# Patient Record
Sex: Male | Born: 1950
Health system: Southern US, Community
[De-identification: ages and names within clinical notes are randomized; demographics above are authoritative.]

## PROBLEM LIST (undated history)

## (undated) DIAGNOSIS — R911 Solitary pulmonary nodule: Secondary | ICD-10-CM

## (undated) DIAGNOSIS — F419 Anxiety disorder, unspecified: Secondary | ICD-10-CM

## (undated) DIAGNOSIS — J439 Emphysema, unspecified: Secondary | ICD-10-CM

## (undated) DIAGNOSIS — H04123 Dry eye syndrome of bilateral lacrimal glands: Secondary | ICD-10-CM

## (undated) DIAGNOSIS — T7840XA Allergy, unspecified, initial encounter: Secondary | ICD-10-CM

## (undated) DIAGNOSIS — K56609 Unspecified intestinal obstruction, unspecified as to partial versus complete obstruction: Secondary | ICD-10-CM

## (undated) DIAGNOSIS — Z87891 Personal history of nicotine dependence: Secondary | ICD-10-CM

## (undated) DIAGNOSIS — J449 Chronic obstructive pulmonary disease, unspecified: Secondary | ICD-10-CM

## (undated) DIAGNOSIS — Z5189 Encounter for other specified aftercare: Secondary | ICD-10-CM

## (undated) DIAGNOSIS — E739 Lactose intolerance, unspecified: Secondary | ICD-10-CM

## (undated) DIAGNOSIS — K5792 Diverticulitis of intestine, part unspecified, without perforation or abscess without bleeding: Secondary | ICD-10-CM

## (undated) DIAGNOSIS — N529 Male erectile dysfunction, unspecified: Secondary | ICD-10-CM

## (undated) HISTORY — DX: Anxiety disorder, unspecified: F41.9

## (undated) HISTORY — DX: Emphysema, unspecified: J43.9

## (undated) HISTORY — DX: Dry eye syndrome of bilateral lacrimal glands: H04.123

## (undated) HISTORY — DX: Allergy, unspecified, initial encounter: T78.40XA

## (undated) HISTORY — DX: Personal history of nicotine dependence: Z87.891

## (undated) HISTORY — DX: Solitary pulmonary nodule: R91.1

## (undated) HISTORY — PX: LUNG SURGERY: SHX703

## (undated) HISTORY — DX: Unspecified intestinal obstruction, unspecified as to partial versus complete obstruction: K56.609

## (undated) HISTORY — PX: HEMORRHOID SURGERY: SHX153

## (undated) HISTORY — PX: POLYPECTOMY: SHX149

## (undated) HISTORY — DX: Male erectile dysfunction, unspecified: N52.9

## (undated) HISTORY — PX: COLONOSCOPY: SHX174

## (undated) HISTORY — DX: Lactose intolerance, unspecified: E73.9

## (undated) HISTORY — DX: Chronic obstructive pulmonary disease, unspecified: J44.9

## (undated) HISTORY — DX: Diverticulitis of intestine, part unspecified, without perforation or abscess without bleeding: K57.92

## (undated) HISTORY — DX: Encounter for other specified aftercare: Z51.89

---

## 2005-04-16 ENCOUNTER — Encounter: Admission: RE | Admit: 2005-04-16 | Discharge: 2005-04-16 | Payer: Self-pay | Admitting: Emergency Medicine

## 2012-01-30 ENCOUNTER — Encounter: Payer: Self-pay | Admitting: Family Medicine

## 2012-01-30 ENCOUNTER — Ambulatory Visit (INDEPENDENT_AMBULATORY_CARE_PROVIDER_SITE_OTHER): Payer: BC Managed Care – PPO | Admitting: Family Medicine

## 2012-01-30 VITALS — BP 124/78 | HR 92 | Temp 97.6°F | Ht 71.0 in | Wt 221.0 lb

## 2012-01-30 DIAGNOSIS — Z8042 Family history of malignant neoplasm of prostate: Secondary | ICD-10-CM

## 2012-01-30 DIAGNOSIS — Z13220 Encounter for screening for lipoid disorders: Secondary | ICD-10-CM

## 2012-01-30 DIAGNOSIS — R634 Abnormal weight loss: Secondary | ICD-10-CM

## 2012-01-30 DIAGNOSIS — J449 Chronic obstructive pulmonary disease, unspecified: Secondary | ICD-10-CM

## 2012-01-30 DIAGNOSIS — N529 Male erectile dysfunction, unspecified: Secondary | ICD-10-CM

## 2012-01-30 MED ORDER — BECLOMETHASONE DIPROP MONOHYD 42 MCG/SPRAY NA SUSP: 2.0000 | Freq: Two times a day (BID) | NASAL | Status: DC

## 2012-01-30 NOTE — Progress Notes (Signed)
New pt.  Old records requested.    H/o emphysema.  Stopped smoking.  H/o PNA late 3/13.  Had been sick mult times in early 2013.  He 'has had a hard 2 weeks.' prev with fever, dec in appetite, was SOB.  Was coughing more now than prev.  Still using his inhalers.  Some improvement now.    FH prostate cancer.  Due for screening.  Inc in nocturia.  No burning with urination.  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    H/o ED- no help with levitra, cialis, viagra.  Had a HA with all 3.    He's losing weight, down about 18 lbs.  He does have diet change (trying to eat a healthy diet) but this is along with lack of appetite (this new).  No vomiting, but he does have more gas than usual.  No diarrhea except while on abx.  No blood in stool.  PMH and SH reviewed  ROS: See HPI, otherwise noncontributory.  Meds, vitals, and allergies reviewed.   GEN: nad, alert and oriented HEENT: mucous membranes moist, nasal epithelium mildly irritated NECK: supple w/o LA CV: rrr. PULM: ctab, no inc wob ABD: soft, +bs EXT: no edema SKIN: no acute rash Prostate gland firm and smooth, no enlargement, nodularity, tenderness, mass, asymmetry or induration.

## 2012-01-30 NOTE — Patient Instructions (Signed)
Don't change your meds for now.  Return for fasting labs.  Take care.  If the weight loss continues, let me know.

## 2012-02-02 ENCOUNTER — Encounter: Payer: Self-pay | Admitting: Family Medicine

## 2012-02-02 DIAGNOSIS — J449 Chronic obstructive pulmonary disease, unspecified: Secondary | ICD-10-CM | POA: Insufficient documentation

## 2012-02-02 DIAGNOSIS — N529 Male erectile dysfunction, unspecified: Secondary | ICD-10-CM | POA: Insufficient documentation

## 2012-02-02 DIAGNOSIS — R634 Abnormal weight loss: Secondary | ICD-10-CM | POA: Insufficient documentation

## 2012-02-02 DIAGNOSIS — Z8042 Family history of malignant neoplasm of prostate: Secondary | ICD-10-CM | POA: Insufficient documentation

## 2012-02-02 NOTE — Assessment & Plan Note (Signed)
Requesting old records.  Return for labs. He agrees.  This could be due to recent illness. Will follow clinically and he'll notify us if continued. Nontoxic.

## 2012-02-02 NOTE — Assessment & Plan Note (Signed)
With recent illness noted.  Lungs ctab today and d/w pt.  Requesting old records.  Return for labs. He agrees.

## 2012-02-02 NOTE — Assessment & Plan Note (Addendum)
DRE wnl, return for PSA.   Addendum- PSA elevated, refer to uro.

## 2012-02-06 ENCOUNTER — Other Ambulatory Visit (INDEPENDENT_AMBULATORY_CARE_PROVIDER_SITE_OTHER): Payer: BC Managed Care – PPO

## 2012-02-06 DIAGNOSIS — R634 Abnormal weight loss: Secondary | ICD-10-CM

## 2012-02-06 DIAGNOSIS — Z8042 Family history of malignant neoplasm of prostate: Secondary | ICD-10-CM

## 2012-02-06 DIAGNOSIS — Z13220 Encounter for screening for lipoid disorders: Secondary | ICD-10-CM

## 2012-02-06 LAB — LIPID PANEL
HDL: 27.3 mg/dL — ABNORMAL LOW (ref >39.00)
Total CHOL/HDL Ratio: 5
Triglycerides: 100 mg/dL (ref 0.0–149.0)
VLDL: 20 mg/dL (ref 0.0–40.0)

## 2012-02-06 LAB — COMPREHENSIVE METABOLIC PANEL
ALT: 25 U/L (ref 0–53)
Calcium: 9.2 mg/dL (ref 8.4–10.5)
Chloride: 100 mEq/L (ref 96–112)
GFR: 77.74 mL/min (ref >60.00)
Glucose, Bld: 96 mg/dL (ref 70–99)
Potassium: 5 mEq/L (ref 3.5–5.1)
Sodium: 138 mEq/L (ref 135–145)

## 2012-02-06 LAB — CBC WITH DIFFERENTIAL/PLATELET
Basophils Relative: 1.5 % (ref 0.0–3.0)
Eosinophils Absolute: 0.6 10*3/uL (ref 0.0–0.7)
HCT: 43.9 % (ref 39.0–52.0)
Lymphocytes Relative: 25.5 % (ref 12.0–46.0)
MCHC: 33.6 g/dL (ref 30.0–36.0)
Neutro Abs: 5 10*3/uL (ref 1.4–7.7)
Neutrophils Relative %: 59 % (ref 43.0–77.0)
Platelets: 566 10*3/uL — ABNORMAL HIGH (ref 150.0–400.0)
WBC: 8.5 10*3/uL (ref 4.5–10.5)

## 2012-02-06 LAB — TSH: TSH: 1.2 u[IU]/mL (ref 0.35–5.50)

## 2012-02-08 NOTE — Progress Notes (Signed)
Addended by: Lars Mage on: 02/08/2012 11:34 PM   Modules accepted: Orders

## 2012-02-19 ENCOUNTER — Telehealth: Payer: Self-pay

## 2012-02-19 DIAGNOSIS — J189 Pneumonia, unspecified organism: Secondary | ICD-10-CM

## 2012-02-19 NOTE — Telephone Encounter (Signed)
Pt is not taking Spiriva as often and prostate is not as large as when seen by Dr Para March 01/30/12. Pt will keep appt with urologist on 03/05/12. Pt called to cancel appt with Dr Clarene Duke and pt spoke with Dr Clarene Duke re: cxr pt had 1 1/2 months ago which showed spots on lung and pt treated for pneumonia.Dr Clarene Duke suggest cxr repeated and will sent cxr to Dr Para March today. Pt can be reached at [phone]. Pt uses [facility] if pharmacy needed. Pt also said if needs any appt should be on a Friday.

## 2012-02-19 NOTE — Telephone Encounter (Signed)
If it was rec'd that patient have f/u CXR, then 6 week f/u is reasonable (that would be about this time).  See if he can be added to the schedule with CXR on the way in.  Orders are in.  I'll await the cxr from the outside clinic.

## 2012-02-19 NOTE — Telephone Encounter (Signed)
Appt scheduled 11:15 on 02/20/2012 for repeat CXR and follow up with [facility].

## 2012-02-20 ENCOUNTER — Ambulatory Visit (INDEPENDENT_AMBULATORY_CARE_PROVIDER_SITE_OTHER)
Admission: RE | Admit: 2012-02-20 | Discharge: 2012-02-20 | Disposition: A | Discharge status: Home / Self Care | Payer: BC Managed Care – PPO | Source: Ambulatory Visit | Attending: Family Medicine | Admitting: Family Medicine

## 2012-02-20 ENCOUNTER — Ambulatory Visit (INDEPENDENT_AMBULATORY_CARE_PROVIDER_SITE_OTHER): Payer: BC Managed Care – PPO | Admitting: Family Medicine

## 2012-02-20 ENCOUNTER — Encounter: Payer: Self-pay | Admitting: Family Medicine

## 2012-02-20 VITALS — BP 116/76 | HR 74 | Temp 98.6°F | Wt 220.0 lb

## 2012-02-20 DIAGNOSIS — J189 Pneumonia, unspecified organism: Secondary | ICD-10-CM

## 2012-02-20 DIAGNOSIS — J449 Chronic obstructive pulmonary disease, unspecified: Secondary | ICD-10-CM

## 2012-02-20 MED ORDER — BUDESONIDE-FORMOTEROL FUMARATE 160-4.5 MCG/ACT IN AERO: 1.0000 | INHALATION_SPRAY | Freq: Two times a day (BID) | RESPIRATORY_TRACT | Status: DC

## 2012-02-20 NOTE — Patient Instructions (Signed)
Stop the spiriva. Use symbicort 1 puff twice a day.  See if that helps.  If so, continue.  If not, notify me.  Keep the appointment with urology.  We'll contact you with your xray report.

## 2012-02-20 NOTE — Progress Notes (Signed)
Prev CXR at outside facility with concern for PNA on posterior lower lobe on lateral film.  Repeat CXR done today.  Cough is improved and he's taking spiriva less.  He's much improved with the nasal steroid.  He may need another med other than spiriva due to urinary retention.  He is a former smoker.  His cough is worse in the fall and spring.    He is going to see uro about his elevated PSA.  Weight is stable.    Meds, vitals, and allergies reviewed.   ROS: See HPI.  Otherwise, noncontributory.  nad ncat Mmm rrr ctab Ext well perfused.   cxr reviewed.

## 2012-02-22 NOTE — Assessment & Plan Note (Addendum)
Stop spiriva, try sample of symbicort and see if that helps. See notes on CXR report, will notify pt.  I was awaiting overread.  >25 min spent with face to face with patient, >50% counseling and/or coordinating care. I have reviewed prev CXR report, today's CXR and will be in contact with patient re: report.

## 2012-02-23 ENCOUNTER — Other Ambulatory Visit: Payer: Self-pay | Admitting: Family Medicine

## 2012-02-23 DIAGNOSIS — R9389 Abnormal findings on diagnostic imaging of other specified body structures: Secondary | ICD-10-CM

## 2012-02-24 ENCOUNTER — Other Ambulatory Visit: Payer: Self-pay | Admitting: Family Medicine

## 2012-02-24 DIAGNOSIS — R9389 Abnormal findings on diagnostic imaging of other specified body structures: Secondary | ICD-10-CM

## 2012-03-05 ENCOUNTER — Other Ambulatory Visit: Payer: Self-pay | Admitting: Family Medicine

## 2012-03-05 ENCOUNTER — Ambulatory Visit (INDEPENDENT_AMBULATORY_CARE_PROVIDER_SITE_OTHER)
Admission: RE | Admit: 2012-03-05 | Discharge: 2012-03-05 | Disposition: A | Discharge status: Home / Self Care | Payer: BC Managed Care – PPO | Source: Ambulatory Visit | Attending: Family Medicine | Admitting: Family Medicine

## 2012-03-05 DIAGNOSIS — R918 Other nonspecific abnormal finding of lung field: Secondary | ICD-10-CM

## 2012-03-05 DIAGNOSIS — R9389 Abnormal findings on diagnostic imaging of other specified body structures: Secondary | ICD-10-CM

## 2012-03-05 MED ORDER — IOHEXOL 300 MG/ML  SOLN
80.0000 mL | Freq: Once | INTRAMUSCULAR | Status: AC | PRN
  Administered 2012-03-05: 80 mL via INTRAVENOUS

## 2012-03-08 ENCOUNTER — Telehealth: Payer: Self-pay | Admitting: Family Medicine

## 2012-03-08 NOTE — Telephone Encounter (Signed)
Patient requests results on repeated PSA tests.  Please call patient to discuss.

## 2012-03-08 NOTE — Telephone Encounter (Signed)
I called pt. The repeat PSA was at [facility].  I don't have those results yet.  He'll call uro about the results.

## 2012-03-09 ENCOUNTER — Telehealth: Payer: Self-pay

## 2012-03-09 NOTE — Telephone Encounter (Signed)
Noted.  I'll await uro notes and input from Dr. Delford Field.

## 2012-03-09 NOTE — Telephone Encounter (Signed)
Pt said was seen at [facility] and wanted Dr Para March to know pts most recent PSA was 2.69. Pt can be reached at [phone]. Pt also wanted to know if appt already scheduled with Dr Delford Field on 03/12/12 pt can have biopsy done then. Explained it was a consultation appt and Dr Delford Field will talk with pt at that time but a biopsy is not scheduled on 03/12/12.

## 2012-03-12 ENCOUNTER — Ambulatory Visit (INDEPENDENT_AMBULATORY_CARE_PROVIDER_SITE_OTHER): Payer: BC Managed Care – PPO | Admitting: Critical Care Medicine

## 2012-03-12 ENCOUNTER — Encounter: Payer: Self-pay | Admitting: Critical Care Medicine

## 2012-03-12 ENCOUNTER — Other Ambulatory Visit: Payer: Self-pay

## 2012-03-12 VITALS — BP 122/88 | HR 75 | Temp 97.7°F | Ht 71.0 in | Wt 220.4 lb

## 2012-03-12 DIAGNOSIS — J449 Chronic obstructive pulmonary disease, unspecified: Secondary | ICD-10-CM

## 2012-03-12 DIAGNOSIS — R918 Other nonspecific abnormal finding of lung field: Secondary | ICD-10-CM

## 2012-03-12 DIAGNOSIS — R222 Localized swelling, mass and lump, trunk: Secondary | ICD-10-CM

## 2012-03-12 DIAGNOSIS — R9389 Abnormal findings on diagnostic imaging of other specified body structures: Secondary | ICD-10-CM | POA: Insufficient documentation

## 2012-03-12 MED ORDER — BUDESONIDE-FORMOTEROL FUMARATE 160-4.5 MCG/ACT IN AERO: 2.0000 | INHALATION_SPRAY | Freq: Two times a day (BID) | RESPIRATORY_TRACT | Status: DC

## 2012-03-12 NOTE — Telephone Encounter (Signed)
Sent!

## 2012-03-12 NOTE — Assessment & Plan Note (Signed)
Moderate chronic obstructive lung disease with emphysematous component on CT scan Plan Patient will increase Symbicort to 2 puffs twice daily

## 2012-03-12 NOTE — Telephone Encounter (Signed)
Pt walked in; saw Dr Delford Field today; Symbicort increased to 2 puffs twice a day.Pt needs more samples or refill with instruction changes. No available samples.[facility].Please advise.

## 2012-03-12 NOTE — Progress Notes (Signed)
Subjective:    Patient ID: Randall Davis, male    DOB: [birth year], 61 y.o.   MRN: [id number]  HPI  61 y.o. WM   Referred for abn CT.  Started with chest pain RUL area 3/13. DX PNA.  Rx ABX and prednisone. Rov 10days later, felt somewhat better.  Tfr MDs to Dr Para March.  Abn CXR on repeat. CT chest done and now here for eval. Dx Copd with emphysema. 67yrs ago after quit smoking .  Prior rx spiriva. Only using prn. Allergies make dyspnea worse.  Prostate swollen and off spiriva completely. Now on symbicort and prostate.  Uses symbicort 2puffs daily .  Albuterol is aves twice daily.   Also takes 30mg  pseudofed OTC Pt has a lot of pndrip.   Now no real cough.  Dyspnea is now better.  No longer has pain in chest area.  Past Medical History  Diagnosis Date  . Former smoker   . COPD (chronic obstructive pulmonary disease)   . Allergy      Family History  Problem Relation Age of Onset  . Asthma Mother   . Prostate cancer Father   . Colon cancer Neg Hx   . Allergies Mother      History   Social History  . Marital Status: Married    Spouse Name: N/A    Number of Children: N/A  . Years of Education: N/A   Occupational History  . [employer] doctor    Social History Main Topics  . Smoking status: Former Smoker -- 1.5 packs/day for 40 years    Types: Cigarettes    Quit date: 10/20/2001  . Smokeless tobacco: Never Used  . Alcohol Use: Yes     1 drink per week  . Drug Use: No  . Sexually Active: Not on file   Other Topics Concern  . Not on file   Social History Narrative   Married [id number] kidsTrucker, former Administrator, sports workLikes to sail     Allergies  Allergen Reactions  . Novocain (Procaine Hcl)     Intolerant in distant past  . Spiriva (Tiotropium Bromide Monohydrate)     Urinary retention     Outpatient Prescriptions Prior to Visit  Medication Sig Dispense Refill  . albuterol (PROVENTIL HFA;VENTOLIN HFA) 108 (90 BASE) MCG/ACT inhaler Inhale 2 puffs into the lungs every 6  (six) hours as needed.      . B Complex-C (B-COMPLEX WITH VITAMIN C) tablet Take 1 tablet by mouth daily as needed.       . beclomethasone (BECONASE-AQ) 42 MCG/SPRAY nasal spray Place 2 sprays into the nose 2 (two) times daily. Dose is for each nostril.  25 g  12  . dextromethorphan-guaiFENesin (MUCINEX DM) 30-600 MG per 12 hr tablet Take 1 tablet by mouth every 12 (twelve) hours as needed.       . Potassium 75 MG TABS Take 1 tablet by mouth. Once weekly      . pseudoephedrine (SUDAFED) 30 MG tablet Take 30 mg by mouth every 6 (six) hours as needed.      . budesonide-formoterol (SYMBICORT) 160-4.5 MCG/ACT inhaler Inhale 1 puff into the lungs 2 (two) times daily.  1 Inhaler  0     Review of Systems  Constitutional: Positive for appetite change and unexpected weight change. Negative for fever, chills, diaphoresis, activity change and fatigue.  HENT: Positive for congestion, rhinorrhea, neck stiffness, dental problem and sinus pressure. Negative for hearing loss, ear pain, nosebleeds, sore throat, facial swelling, sneezing,  mouth sores, trouble swallowing, neck pain, voice change, postnasal drip, tinnitus and ear discharge.   Eyes: Positive for itching. Negative for photophobia, discharge and visual disturbance.  Respiratory: Positive for cough and shortness of breath. Negative for apnea, choking, chest tightness, wheezing and stridor.   Cardiovascular: Positive for chest pain. Negative for palpitations and leg swelling.  Gastrointestinal: Negative for nausea, vomiting, abdominal pain, constipation, blood in stool and abdominal distention.  Genitourinary: Positive for decreased urine volume and difficulty urinating. Negative for dysuria, urgency, frequency, hematuria and flank pain.  Musculoskeletal: Negative for myalgias, back pain, joint swelling, arthralgias and gait problem.  Skin: Negative for color change, pallor and rash.  Neurological: Positive for dizziness and headaches. Negative for  tremors, seizures, syncope, speech difficulty, weakness, light-headedness and numbness.  Hematological: Negative for adenopathy. Does not bruise/bleed easily.  Psychiatric/Behavioral: Positive for sleep disturbance. Negative for confusion and agitation. The patient is not nervous/anxious.        Objective:   Physical Exam  Filed Vitals:   03/12/12 1037  BP: 122/88  Pulse: 75  Temp: 97.7 F (36.5 C)  TempSrc: Oral  Height: 5\' 11"  (1.803 m)  Weight: 220 lb 6.4 oz (99.973 kg)  SpO2: 96%    Gen: Pleasant, well-nourished, in no distress,  normal affect  ENT: No lesions,  mouth clear,  oropharynx clear, no postnasal drip  Neck: No JVD, no TMG, no carotid bruits  Lungs: No use of accessory muscles, no dullness to percussion, distant breath sounds  Cardiovascular: RRR, heart sounds normal, no murmur or gallops, no peripheral edema  Abdomen: soft and NT, no HSM,  BS normal  Musculoskeletal: No deformities, no cyanosis or clubbing  Neuro: alert, non focal  Skin: Warm, no lesions or rashes  CT scan 02/23/2012 is reviewed and reveals fibrotic in inflammatory changes right upper lobe with associated mediastinal and subcarinal mild adenopathy along with fibrotic changes throughout the lungs with centrilobular emphysema changes, there is a large cystic area in the left lower lobe which is likely the source of the patient's prior pneumothorax at age 21      Assessment & Plan:   COPD (chronic obstructive pulmonary disease) Moderate chronic obstructive lung disease with emphysematous component on CT scan Plan Patient will increase Symbicort to 2 puffs twice daily  Abnormal CT of the chest Right upper lobe scarring post pneumonia in the setting of fibrotic disease in the lung and emphysematous changes due to chronic obstructive lung disease. Cannot rule out malignancy in this setting Plan Pursue bronchoscopy on 03/19/2012 Further recommendations will follow    Updated  Medication List Outpatient Encounter Prescriptions as of 03/12/2012  Medication Sig Dispense Refill  . albuterol (PROVENTIL HFA;VENTOLIN HFA) 108 (90 BASE) MCG/ACT inhaler Inhale 2 puffs into the lungs every 6 (six) hours as needed.      . B Complex-C (B-COMPLEX WITH VITAMIN C) tablet Take 1 tablet by mouth daily as needed.       . beclomethasone (BECONASE-AQ) 42 MCG/SPRAY nasal spray Place 2 sprays into the nose 2 (two) times daily. Dose is for each nostril.  25 g  12  . budesonide-formoterol (SYMBICORT) 160-4.5 MCG/ACT inhaler Inhale 2 puffs into the lungs 2 (two) times daily.  1 Inhaler    . dextromethorphan-guaiFENesin (MUCINEX DM) 30-600 MG per 12 hr tablet Take 1 tablet by mouth every 12 (twelve) hours as needed.       . Potassium 75 MG TABS Take 1 tablet by mouth. Once weekly      .  pseudoephedrine (SUDAFED) 30 MG tablet Take 30 mg by mouth every 6 (six) hours as needed.      Marland Kitchen DISCONTD: budesonide-formoterol (SYMBICORT) 160-4.5 MCG/ACT inhaler Inhale 1 puff into the lungs 2 (two) times daily.  1 Inhaler  0  . DISCONTD: budesonide-formoterol (SYMBICORT) 160-4.5 MCG/ACT inhaler Inhale 2 puffs into the lungs every morning.

## 2012-03-12 NOTE — Assessment & Plan Note (Signed)
Right upper lobe scarring post pneumonia in the setting of fibrotic disease in the lung and emphysematous changes due to chronic obstructive lung disease. Cannot rule out malignancy in this setting Plan Pursue bronchoscopy on 03/19/2012 Further recommendations will follow

## 2012-03-12 NOTE — Patient Instructions (Addendum)
A bronchoscopy will be obtained at 5/31/3 at 130pm arrive at 1230pm Increase symbicort to two puff twice daily Pulmonary functions will be obtained Return 3 weeks

## 2012-03-18 ENCOUNTER — Encounter (HOSPITAL_COMMUNITY): Payer: Self-pay

## 2012-03-19 ENCOUNTER — Encounter (HOSPITAL_COMMUNITY)
Admission: RE | Disposition: A | Discharge status: Home / Self Care | Payer: Self-pay | Source: Ambulatory Visit | Attending: Critical Care Medicine

## 2012-03-19 ENCOUNTER — Ambulatory Visit (HOSPITAL_COMMUNITY): Payer: BC Managed Care – PPO

## 2012-03-19 ENCOUNTER — Ambulatory Visit (HOSPITAL_COMMUNITY)
Admission: RE | Admit: 2012-03-19 | Discharge: 2012-03-19 | Disposition: A | Discharge status: Home / Self Care | Payer: BC Managed Care – PPO | Source: Ambulatory Visit | Attending: Critical Care Medicine | Admitting: Critical Care Medicine

## 2012-03-19 ENCOUNTER — Encounter (HOSPITAL_COMMUNITY): Payer: Self-pay | Admitting: Respiratory Therapy

## 2012-03-19 DIAGNOSIS — R918 Other nonspecific abnormal finding of lung field: Secondary | ICD-10-CM

## 2012-03-19 DIAGNOSIS — J449 Chronic obstructive pulmonary disease, unspecified: Secondary | ICD-10-CM | POA: Diagnosis present

## 2012-03-19 DIAGNOSIS — R222 Localized swelling, mass and lump, trunk: Secondary | ICD-10-CM

## 2012-03-19 DIAGNOSIS — Z87891 Personal history of nicotine dependence: Secondary | ICD-10-CM | POA: Insufficient documentation

## 2012-03-19 DIAGNOSIS — J438 Other emphysema: Secondary | ICD-10-CM | POA: Insufficient documentation

## 2012-03-19 DIAGNOSIS — J984 Other disorders of lung: Secondary | ICD-10-CM | POA: Insufficient documentation

## 2012-03-19 DIAGNOSIS — Z79899 Other long term (current) drug therapy: Secondary | ICD-10-CM | POA: Insufficient documentation

## 2012-03-19 DIAGNOSIS — R9389 Abnormal findings on diagnostic imaging of other specified body structures: Secondary | ICD-10-CM | POA: Diagnosis present

## 2012-03-19 HISTORY — PX: VIDEO BRONCHOSCOPY: SHX5072

## 2012-03-19 SURGERY — BRONCHOSCOPY, WITH FLUOROSCOPY
Anesthesia: Moderate Sedation

## 2012-03-19 MED ORDER — FENTANYL CITRATE 0.05 MG/ML IJ SOLN
INTRAMUSCULAR | Status: AC
  Filled 2012-03-19: qty 4

## 2012-03-19 MED ORDER — MIDAZOLAM HCL 10 MG/2ML IJ SOLN
INTRAMUSCULAR | Status: AC
  Filled 2012-03-19: qty 4

## 2012-03-19 MED ORDER — BUTAMBEN-TETRACAINE-BENZOCAINE 2-2-14 % EX AERO: 1.0000 | INHALATION_SPRAY | Freq: Once | CUTANEOUS | Status: DC

## 2012-03-19 MED ORDER — FENTANYL CITRATE 0.05 MG/ML IJ SOLN
INTRAMUSCULAR | Status: DC | PRN
  Administered 2012-03-19: 20 ug via INTRAVENOUS
  Administered 2012-03-19: 30 ug via INTRAVENOUS

## 2012-03-19 MED ORDER — LIDOCAINE HCL 2 % EX GEL: Freq: Once | CUTANEOUS | Status: DC

## 2012-03-19 MED ORDER — SODIUM CHLORIDE 0.9 % IV SOLN
INTRAVENOUS | Status: DC
  Administered 2012-03-19: 13:00:00 via INTRAVENOUS

## 2012-03-19 MED ORDER — PHENYLEPHRINE HCL 0.25 % NA SOLN
NASAL | Status: DC | PRN
  Administered 2012-03-19: 1 via NASAL

## 2012-03-19 MED ORDER — LIDOCAINE HCL 2 % EX GEL
CUTANEOUS | Status: DC | PRN
  Administered 2012-03-19: 1

## 2012-03-19 MED ORDER — LIDOCAINE HCL 1 % IJ SOLN
INTRAMUSCULAR | Status: DC | PRN
  Administered 2012-03-19: 6 mL via RESPIRATORY_TRACT

## 2012-03-19 MED ORDER — MIDAZOLAM HCL 10 MG/2ML IJ SOLN
INTRAMUSCULAR | Status: DC | PRN
  Administered 2012-03-19: 3 mg via INTRAVENOUS
  Administered 2012-03-19: 1 mg via INTRAVENOUS

## 2012-03-19 MED ORDER — PHENYLEPHRINE HCL 0.25 % NA SOLN
1.0000 | Freq: Four times a day (QID) | NASAL | Status: DC | PRN
  Filled 2012-03-19: qty 15

## 2012-03-19 NOTE — Progress Notes (Signed)
Video Bronchoscopy  Intervention Bronchial Biopsy Intervention Bronchial Washings  Procedure tolerated well

## 2012-03-19 NOTE — Discharge Instructions (Signed)
Bronchoscopy Care After These instructions give you information on caring for yourself after your procedure. Your doctor may also give you specific instructions. Call your doctor if you have any problems or questions after your procedure. HOME CARE  Do not eat or drink anything for 2 hours after your test. Your nose and throat was numbed by medicine. If you try to eat or drink before the medicine wears off, food or drink could go into your lungs.   For the rest of the first day, eat soft food and drink liquids slowly.   On the day after the test, you can go back to eating your usual food.   Do not drive or sign important papers the day of the test.   Take it easy for the next 2 days. Do not do any heavy work, exercise, or activities.   Only take medicine as told by your doctor. Do not take aspirin.   You may be drowsy for the next 24 hours.   You may see traces of blood in your spit for 1 to 2 days.  Finding out the results of your test Ask when your test results will be ready. Make sure you get your test results. GET HELP RIGHT AWAY IF:  You have breathing problems.   You have a bad sore throat for more than 1 week.   You see traces of blood in your spit for more than 3 days.   You start coughing up blood.   You have a temperature of 102 F (38.9 C) or higher.  MAKE SURE YOU:  Understand these instructions.   Will watch your condition.   Will get help right away if you are not doing well or get worse.  Document Released: 08/03/2009 Document Revised: 09/25/2011 Document Reviewed: 08/03/2009 ExitCare Patient Information 2012 ExitCare, [place]  DISCHARGE INSTRUCTIONS TO PATIENT  Medications and dosages:current medication regimen unchanged.  REMINDER:   Carry a list of your medications and allergies with you at all times  Call your pharmacy at least 1 week in advance to refill prescriptions  Do not mix any prescribed pain medicine with alcohol  Do not drive any  motor vehicles while taking pain medication.  Take medications with food.  Do not retake a pain medication if you vomit after taking it, unless you check with the Doctor.  Activity: Slowly increase activity.  May return to normal activity/work in AM.   Follow-up appointments (date to return to physician): Return to [facility] next visit  Call Surgeon if you have:  Temperature greater than 100.4  Persistent nausea and vomiting  Severe uncontrolled pain  Redness, tenderness, or signs of infection (pain, swelling, redness, odor or green/yellow discharge around the site)  Difficulty breathing, headache or visual disturbances  Hives  Persistent dizziness or light-headedness  Extreme fatigue  Any other questions or concerns you may have after discharge  In an emergency, call 911 or go to an Emergency Department at a nearby hospital    Diet:   Begin with liquids, and if they are tolerated, resume your usual diet.  Avoid spicy, greasy or heavy foods.  If you have nausea or vomiting, go back to liquids.  If you cannot keep liquids down, call your doctor.  Avoid alcohol consumption while on prescription pain medications. Good nutrition promotes healing. Increase fiber and fluids.    Education Materials Received: :yes Belongings Returned: yes   I understand and acknowledge receipt of the above instructions.  Patient or Guardian Signature                                                                    Date/Time                                                                                                                                        Physician's or R.N.'s Signature                                                                  Date/Time  The discharge instructions have been reviewed with the patient and/or Family  Member/Parent/Guardian.  Patient and/or Family Member/Parent/Guardian signed and retained a printed copy.  Marland Kitchen

## 2012-03-19 NOTE — Interval H&P Note (Signed)
Pt seen and examined. There are no interval changes in history and physical and the pt is ready for sedation.  Shan Levans Beeper  [phone]  Cell  [phone]  If no response or cell goes to voicemail, call beeper [phone]

## 2012-03-19 NOTE — Progress Notes (Signed)
I agree with above note Keanu Frickey Beeper  [phone]  Cell  [phone]  If no response or cell goes to voicemail, call beeper [phone]  

## 2012-03-19 NOTE — Op Note (Signed)
Bronchoscopy Procedure Note  Date of Operation: 03/19/2012  Pre-op Diagnosis: RUL scar vs mass   Post-op Diagnosis: RUL scar vs CA,  Doubt CA  Surgeon: Shan Levans  Anesthesia: Monitored Local Anesthesia with Sedation  4mg  Versed IV  Fentanyl IV  Operation: Flexible fiberoptic bronchoscopy, diagnostic   Findings: No endobronchial lesions  Specimen: TBBX, Bronch wash RUL  Estimated Blood Loss: Minimal  Complications: none  Indications and History: The patient is a 61 y.o. male with RUL scar vs mass.  The risks, benefits, complications, treatment options and expected outcomes were discussed with the patient.  The possibilities of reaction to medication, pulmonary aspiration, perforation of a viscus, bleeding, failure to diagnose a condition and creating a complication requiring transfusion or operation were discussed with the patient who freely signed the consent.    Description of Procedure: The patient was re-examined in the bronchoscopy suite and the site of surgery properly noted/marked.  The patient was identified as Randall Davis and the procedure verified as Flexible Fiberoptic Bronchoscopy.  A Time Out was held and the above information confirmed.   After the induction of topical nasopharyngeal anesthesia, the patient was positioned  and the bronchoscope was passed through the R  nares. The vocal cords were visualized and  1% buffered lidocaine 5 ml was topically placed onto the cords. The cords were normal . The scope was then passed into the trachea.  1% buffered lidocaine 5 ml was used topically on the carina.  Careful inspection of the tracheal lumen was accomplished. The scope was sequentially passed into the left main and then left upper and lower bronchi and segmental bronchi.      The scope was then withdrawn and advanced into the right main bronchus and then into the RUL, RML, and RLL bronchi and segmental bronchi.   RUL TBBx and Bronch wash  was done and there  were  Two  specimens.   Endobronchial findings: normal airway Trachea: Normal mucosa Carina: Normal mucosa Right main bronchus: Normal mucosa Right upper lobe bronchus: Normal mucosa Right upper lobe bronchus: Normal mucosa Right upper lobe bronchus: Normal mucosa Left main bronchus: Normal mucosa Left upper lobe bronchus: Normal mucosa Left lower lobe bronchus: Normal mucosa  The Patient was taken to the Endoscopy Recovery area in satisfactory condition.  Attestation: I performed the procedure.  Shan Levans

## 2012-03-19 NOTE — H&P (View-Only) (Signed)
Subjective:    Patient ID: Randall Davis, male    DOB: 08/24/1951, 61 y.o.   MRN: [id number]  HPI  61 y.o. WM   Referred for abn CT.  Started with chest pain RUL area 3/13. DX PNA.  Rx ABX and prednisone. Rov 10days later, felt somewhat better.  Tfr MDs to Dr Duncan.  Abn CXR on repeat. CT chest done and now here for eval. Dx Copd with emphysema. 10yrs ago after quit smoking .  Prior rx spiriva. Only using prn. Allergies make dyspnea worse.  Prostate swollen and off spiriva completely. Now on symbicort and prostate.  Uses symbicort 2puffs daily .  Albuterol is aves twice daily.   Also takes 30mg pseudofed OTC Pt has a lot of pndrip.   Now no real cough.  Dyspnea is now better.  No longer has pain in chest area.  Past Medical History  Diagnosis Date  . Former smoker   . COPD (chronic obstructive pulmonary disease)   . Allergy      Family History  Problem Relation Age of Onset  . Asthma Mother   . Prostate cancer Father   . Colon cancer Neg Hx   . Allergies Mother      History   Social History  . Marital Status: Married    Spouse Name: N/A    Number of Children: N/A  . Years of Education: N/A   Occupational History  . Truck Driver    Social History Main Topics  . Smoking status: Former Smoker -- 1.5 packs/day for 40 years    Types: Cigarettes    Quit date: 10/20/2001  . Smokeless tobacco: Never Used  . Alcohol Use: Yes     1 drink per week  . Drug Use: No  . Sexually Active: Not on file   Other Topics Concern  . Not on file   Social History Narrative   Married [id number] kidsTrucker, former cabinet workLikes to sail     Allergies  Allergen Reactions  . Novocain (Procaine Hcl)     Intolerant in distant past  . Spiriva (Tiotropium Bromide Monohydrate)     Urinary retention     Outpatient Prescriptions Prior to Visit  Medication Sig Dispense Refill  . albuterol (PROVENTIL HFA;VENTOLIN HFA) 108 (90 BASE) MCG/ACT inhaler Inhale 2 puffs into the lungs every 6  (six) hours as needed.      . B Complex-C (B-COMPLEX WITH VITAMIN C) tablet Take 1 tablet by mouth daily as needed.       . beclomethasone (BECONASE-AQ) 42 MCG/SPRAY nasal spray Place 2 sprays into the nose 2 (two) times daily. Dose is for each nostril.  25 g  12  . dextromethorphan-guaiFENesin (MUCINEX DM) 30-600 MG per 12 hr tablet Take 1 tablet by mouth every 12 (twelve) hours as needed.       . Potassium 75 MG TABS Take 1 tablet by mouth. Once weekly      . pseudoephedrine (SUDAFED) 30 MG tablet Take 30 mg by mouth every 6 (six) hours as needed.      . budesonide-formoterol (SYMBICORT) 160-4.5 MCG/ACT inhaler Inhale 1 puff into the lungs 2 (two) times daily.  1 Inhaler  0     Review of Systems  Constitutional: Positive for appetite change and unexpected weight change. Negative for fever, chills, diaphoresis, activity change and fatigue.  HENT: Positive for congestion, rhinorrhea, neck stiffness, dental problem and sinus pressure. Negative for hearing loss, ear pain, nosebleeds, sore throat, facial swelling, sneezing,   mouth sores, trouble swallowing, neck pain, voice change, postnasal drip, tinnitus and ear discharge.   Eyes: Positive for itching. Negative for photophobia, discharge and visual disturbance.  Respiratory: Positive for cough and shortness of breath. Negative for apnea, choking, chest tightness, wheezing and stridor.   Cardiovascular: Positive for chest pain. Negative for palpitations and leg swelling.  Gastrointestinal: Negative for nausea, vomiting, abdominal pain, constipation, blood in stool and abdominal distention.  Genitourinary: Positive for decreased urine volume and difficulty urinating. Negative for dysuria, urgency, frequency, hematuria and flank pain.  Musculoskeletal: Negative for myalgias, back pain, joint swelling, arthralgias and gait problem.  Skin: Negative for color change, pallor and rash.  Neurological: Positive for dizziness and headaches. Negative for  tremors, seizures, syncope, speech difficulty, weakness, light-headedness and numbness.  Hematological: Negative for adenopathy. Does not bruise/bleed easily.  Psychiatric/Behavioral: Positive for sleep disturbance. Negative for confusion and agitation. The patient is not nervous/anxious.        Objective:   Physical Exam  Filed Vitals:   03/12/12 1037  BP: 122/88  Pulse: 75  Temp: 97.7 F (36.5 C)  TempSrc: Oral  Height: 5' 11" (1.803 m)  Weight: 220 lb 6.4 oz (99.973 kg)  SpO2: 96%    Gen: Pleasant, well-nourished, in no distress,  normal affect  ENT: No lesions,  mouth clear,  oropharynx clear, no postnasal drip  Neck: No JVD, no TMG, no carotid bruits  Lungs: No use of accessory muscles, no dullness to percussion, distant breath sounds  Cardiovascular: RRR, heart sounds normal, no murmur or gallops, no peripheral edema  Abdomen: soft and NT, no HSM,  BS normal  Musculoskeletal: No deformities, no cyanosis or clubbing  Neuro: alert, non focal  Skin: Warm, no lesions or rashes  CT scan 02/23/2012 is reviewed and reveals fibrotic in inflammatory changes right upper lobe with associated mediastinal and subcarinal mild adenopathy along with fibrotic changes throughout the lungs with centrilobular emphysema changes, there is a large cystic area in the left lower lobe which is likely the source of the patient's prior pneumothorax at age 20      Assessment & Plan:   COPD (chronic obstructive pulmonary disease) Moderate chronic obstructive lung disease with emphysematous component on CT scan Plan Patient will increase Symbicort to 2 puffs twice daily  Abnormal CT of the chest Right upper lobe scarring post pneumonia in the setting of fibrotic disease in the lung and emphysematous changes due to chronic obstructive lung disease. Cannot rule out malignancy in this setting Plan Pursue bronchoscopy on 03/19/2012 Further recommendations will follow    Updated  Medication List Outpatient Encounter Prescriptions as of 03/12/2012  Medication Sig Dispense Refill  . albuterol (PROVENTIL HFA;VENTOLIN HFA) 108 (90 BASE) MCG/ACT inhaler Inhale 2 puffs into the lungs every 6 (six) hours as needed.      . B Complex-C (B-COMPLEX WITH VITAMIN C) tablet Take 1 tablet by mouth daily as needed.       . beclomethasone (BECONASE-AQ) 42 MCG/SPRAY nasal spray Place 2 sprays into the nose 2 (two) times daily. Dose is for each nostril.  25 g  12  . budesonide-formoterol (SYMBICORT) 160-4.5 MCG/ACT inhaler Inhale 2 puffs into the lungs 2 (two) times daily.  1 Inhaler    . dextromethorphan-guaiFENesin (MUCINEX DM) 30-600 MG per 12 hr tablet Take 1 tablet by mouth every 12 (twelve) hours as needed.       . Potassium 75 MG TABS Take 1 tablet by mouth. Once weekly      .   pseudoephedrine (SUDAFED) 30 MG tablet Take 30 mg by mouth every 6 (six) hours as needed.      . DISCONTD: budesonide-formoterol (SYMBICORT) 160-4.5 MCG/ACT inhaler Inhale 1 puff into the lungs 2 (two) times daily.  1 Inhaler  0  . DISCONTD: budesonide-formoterol (SYMBICORT) 160-4.5 MCG/ACT inhaler Inhale 2 puffs into the lungs every morning.          

## 2012-03-22 ENCOUNTER — Encounter (HOSPITAL_COMMUNITY): Payer: Self-pay | Admitting: Critical Care Medicine

## 2012-03-22 LAB — CULTURE, RESPIRATORY W GRAM STAIN: Special Requests: NORMAL

## 2012-03-25 ENCOUNTER — Telehealth: Payer: Self-pay | Admitting: Family Medicine

## 2012-03-25 NOTE — Telephone Encounter (Signed)
He needs to talk to them about the bill.  I have the note from the OV with them but I don't have the repeat PSA results.  I need to see that before offering any other advice.  Please see about getting that.  Thanks.

## 2012-03-25 NOTE — Telephone Encounter (Signed)
Pt is concerned about a bill from [facility] that was 500 and some dollars. Pt said he will pay that but he does not want to go back because that was just way to much. He was wondering if there were any other options or if he should go back to a urologist because his PSA is back to normal.

## 2012-03-25 NOTE — Telephone Encounter (Signed)
Labs including PSA requested from [facility]. Will notify patient when records received and reviewed.

## 2012-03-29 ENCOUNTER — Encounter: Payer: Self-pay | Admitting: Family Medicine

## 2012-03-29 ENCOUNTER — Telehealth: Payer: Self-pay | Admitting: Family Medicine

## 2012-03-29 NOTE — Telephone Encounter (Signed)
Uro notes and PSA reviewed.  He can continue to get yearly PSAs at [facility], but if he wants treatment for ED, then he'll need to f/u with uro. Thanks.

## 2012-03-30 ENCOUNTER — Telehealth: Payer: Self-pay | Admitting: Critical Care Medicine

## 2012-03-30 ENCOUNTER — Telehealth: Payer: Self-pay | Admitting: *Deleted

## 2012-03-30 NOTE — Telephone Encounter (Signed)
Patient advised.  He says that getting his PSA's here is what he would like to do and that Urology really didn't have anything to offer him concerning his ED but that if he has to see Urology in the future, he would prefer someone else.

## 2012-03-30 NOTE — Telephone Encounter (Signed)
If continued, then he'll need follow up.  Please notify the pulmonary clinic as a FYI.  Thanks.

## 2012-03-30 NOTE — Telephone Encounter (Signed)
I spoke with the pt and he is c/o  That this AM he was brushing his teeth and his tongue this AM and he gagged and when he did so he states a blood clot came up from his throat into his mouth. He states it was larger then a quarter and was dark brown/red. He states he has not seen any blood since and this is the first time it has happened since bronch on 03-19-12.  I spoke with MR and he advises for pt to watch and if happens again to call. He states that since the pt had a bronch with a biopsy this can happen. Pt aware and will call if happens again. Carron Curie, CMA

## 2012-03-30 NOTE — Telephone Encounter (Signed)
Closed in error.

## 2012-03-30 NOTE — Telephone Encounter (Signed)
LMOVM to return call.

## 2012-03-30 NOTE — Telephone Encounter (Signed)
Noted  

## 2012-03-30 NOTE — Telephone Encounter (Signed)
Patient says he was brushing his teeth this morning and started to cough and coughed up a blood clot.  He said when they did the biopsy, they told him that he might cough up blood but he hasn't until now and it was just that one time.  Please advise.

## 2012-03-30 NOTE — Telephone Encounter (Signed)
Received, see next note.

## 2012-03-30 NOTE — Telephone Encounter (Signed)
Patient advised.  Spoke to Reynolds in Pulmonary and given FYI.  She states she will give a message to the nurse and have them phone the patient to check on him.

## 2012-04-06 ENCOUNTER — Encounter: Payer: Self-pay | Admitting: Critical Care Medicine

## 2012-04-13 ENCOUNTER — Telehealth: Payer: Self-pay | Admitting: Family Medicine

## 2012-04-13 NOTE — Telephone Encounter (Signed)
Pt is wondering if he should come in soon to have his PSA rechecked again. He wasn't sure if he should be coming back for that every so often or a couple times a year, he just wasn't sure.

## 2012-04-14 NOTE — Telephone Encounter (Signed)
Advised patient.  He asks when he should see Dr. Para March again, advised in 6 months for a follow up following his initial visit.

## 2012-04-14 NOTE — Telephone Encounter (Signed)
Recheck yearly.

## 2012-04-26 LAB — FUNGUS CULTURE W SMEAR: Fungal Smear: NONE SEEN

## 2012-04-30 ENCOUNTER — Ambulatory Visit (INDEPENDENT_AMBULATORY_CARE_PROVIDER_SITE_OTHER): Payer: BC Managed Care – PPO | Admitting: Critical Care Medicine

## 2012-04-30 ENCOUNTER — Encounter: Payer: Self-pay | Admitting: Critical Care Medicine

## 2012-04-30 VITALS — BP 116/90 | HR 86 | Temp 97.5°F | Ht 71.0 in | Wt 225.4 lb

## 2012-04-30 DIAGNOSIS — R9389 Abnormal findings on diagnostic imaging of other specified body structures: Secondary | ICD-10-CM

## 2012-04-30 DIAGNOSIS — J449 Chronic obstructive pulmonary disease, unspecified: Secondary | ICD-10-CM

## 2012-04-30 NOTE — Patient Instructions (Addendum)
Stay on symbicort two puff twice daily No other medication changes Return 4 months with Chest xray

## 2012-04-30 NOTE — Progress Notes (Signed)
Subjective:    Patient ID: Randall Davis, male    DOB: [birth year], 61 y.o.   MRN: [id number]  HPI   61 y.o. WM   7/12 No new issues.  Pt doing well on symbicort. Pt denies any significant sore throat, nasal congestion or excess secretions, fever, chills, sweats, unintended weight loss, pleurtic or exertional chest pain, orthopnea PND, or leg swelling Pt denies any increase in rescue therapy over baseline, denies waking up needing it or having any early am or nocturnal exacerbations of coughing/wheezing/or dyspnea. Pt also denies any obvious fluctuation in symptoms with  weather or environmental change or other alleviating or aggravating factors    Past Medical History  Diagnosis Date  . Former smoker   . COPD (chronic obstructive pulmonary disease)   . Allergy      Family History  Problem Relation Age of Onset  . Asthma Mother   . Prostate cancer Father   . Colon cancer Neg Hx   . Allergies Mother      History   Social History  . Marital Status: Married    Spouse Name: N/A    Number of Children: N/A  . Years of Education: N/A   Occupational History  . Truck Hospital doctor    Social History Main Topics  . Smoking status: Former Smoker -- 1.5 packs/day for 40 years    Types: Cigarettes    Quit date: 10/20/2001  . Smokeless tobacco: Never Used  . Alcohol Use: Yes     1 drink per week  . Drug Use: No  . Sexually Active: Not on file   Other Topics Concern  . Not on file   Social History Narrative   Married [id number] kidsTrucker, former Administrator, sports workLikes to sail     Allergies  Allergen Reactions  . Novocain (Procaine Hcl)     Intolerant in distant past  . Spiriva (Tiotropium Bromide Monohydrate)     Urinary retention     Outpatient Prescriptions Prior to Visit  Medication Sig Dispense Refill  . albuterol (PROVENTIL HFA;VENTOLIN HFA) 108 (90 BASE) MCG/ACT inhaler Inhale 2 puffs into the lungs every 6 (six) hours as needed.      . B Complex-C (B-COMPLEX WITH VITAMIN  C) tablet Take 1 tablet by mouth daily as needed.       . beclomethasone (BECONASE-AQ) 42 MCG/SPRAY nasal spray Place 2 sprays into the nose 2 (two) times daily. Dose is for each nostril.  25 g  12  . budesonide-formoterol (SYMBICORT) 160-4.5 MCG/ACT inhaler Inhale 2 puffs into the lungs 2 (two) times daily.  1 Inhaler  12  . dextromethorphan-guaiFENesin (MUCINEX DM) 30-600 MG per 12 hr tablet Take 1 tablet by mouth every 12 (twelve) hours as needed.       . Potassium 75 MG TABS Take 1 tablet by mouth. Once weekly      . pseudoephedrine (SUDAFED) 30 MG tablet Take 30 mg by mouth every 6 (six) hours as needed.         Review of Systems  Constitutional: Positive for appetite change and unexpected weight change. Negative for fever, chills, diaphoresis, activity change and fatigue.  HENT: Positive for neck stiffness. Negative for hearing loss, ear pain, nosebleeds, congestion, sore throat, facial swelling, rhinorrhea, sneezing, mouth sores, trouble swallowing, neck pain, dental problem, voice change, postnasal drip, sinus pressure, tinnitus and ear discharge.   Eyes: Positive for itching. Negative for photophobia, discharge and visual disturbance.  Respiratory: Negative for apnea, cough, choking, chest tightness, shortness  of breath, wheezing and stridor.   Cardiovascular: Positive for chest pain. Negative for palpitations and leg swelling.  Gastrointestinal: Negative for nausea, vomiting, abdominal pain, constipation, blood in stool and abdominal distention.  Genitourinary: Positive for decreased urine volume and difficulty urinating. Negative for dysuria, urgency, frequency, hematuria and flank pain.  Musculoskeletal: Negative for myalgias, back pain, joint swelling, arthralgias and gait problem.  Skin: Negative for color change, pallor and rash.  Neurological: Positive for dizziness and headaches. Negative for tremors, seizures, syncope, speech difficulty, weakness, light-headedness and numbness.    Hematological: Negative for adenopathy. Does not bruise/bleed easily.  Psychiatric/Behavioral: Positive for disturbed wake/sleep cycle. Negative for confusion and agitation. The patient is not nervous/anxious.        Objective:   Physical Exam   Filed Vitals:   04/30/12 1617  BP: 116/90  Pulse: 86  Temp: 97.5 F (36.4 C)  TempSrc: Oral  Height: 5\' 11"  (1.803 m)  Weight: 225 lb 6.4 oz (102.241 kg)  SpO2: 93%    Gen: Pleasant, well-nourished, in no distress,  normal affect  ENT: No lesions,  mouth clear,  oropharynx clear, no postnasal drip  Neck: No JVD, no TMG, no carotid bruits  Lungs: No use of accessory muscles, no dullness to percussion, distant breath sounds  Cardiovascular: RRR, heart sounds normal, no murmur or gallops, no peripheral edema  Abdomen: soft and NT, no HSM,  BS normal  Musculoskeletal: No deformities, no cyanosis or clubbing  Neuro: alert, non focal  Skin: Warm, no lesions or rashes  CT scan 02/23/2012 is reviewed and reveals fibrotic in inflammatory changes right upper lobe with associated mediastinal and subcarinal mild adenopathy along with fibrotic changes throughout the lungs with centrilobular emphysema changes, there is a large cystic area in the left lower lobe which is likely the source of the patient's prior pneumothorax at age 56      Assessment & Plan:   Abnormal CT of the chest Rul scar s/p PNA No CA on FOB 6/13  Repeat CXR in 4 months  COPD (chronic obstructive pulmonary disease) Stable Gold C Copd Plan Cont symbicort daily     Updated Medication List Outpatient Encounter Prescriptions as of 04/30/2012  Medication Sig Dispense Refill  . albuterol (PROVENTIL HFA;VENTOLIN HFA) 108 (90 BASE) MCG/ACT inhaler Inhale 2 puffs into the lungs every 6 (six) hours as needed.      Marland Kitchen aspirin-acetaminophen-caffeine (EXCEDRIN MIGRAINE) 250-250-65 MG per tablet Take 1 tablet by mouth every 6 (six) hours as needed.      . B Complex-C  (B-COMPLEX WITH VITAMIN C) tablet Take 1 tablet by mouth daily as needed.       . beclomethasone (BECONASE-AQ) 42 MCG/SPRAY nasal spray Place 2 sprays into the nose 2 (two) times daily. Dose is for each nostril.  25 g  12  . budesonide-formoterol (SYMBICORT) 160-4.5 MCG/ACT inhaler Inhale 2 puffs into the lungs 2 (two) times daily.  1 Inhaler  12  . dextromethorphan-guaiFENesin (MUCINEX DM) 30-600 MG per 12 hr tablet Take 1 tablet by mouth every 12 (twelve) hours as needed.       . Naproxen Sodium (ALEVE) 220 MG CAPS Take by mouth as needed.      . Potassium 75 MG TABS Take 1 tablet by mouth. Once weekly      . pseudoephedrine (SUDAFED) 30 MG tablet Take 30 mg by mouth every 6 (six) hours as needed.

## 2012-05-01 LAB — AFB CULTURE WITH SMEAR (NOT AT ARMC)

## 2012-05-02 NOTE — Assessment & Plan Note (Signed)
Stable Gold C Copd Plan Cont symbicort daily

## 2012-05-02 NOTE — Assessment & Plan Note (Signed)
Rul scar s/p PNA No CA on FOB 6/13  Repeat CXR in 4 months

## 2012-11-23 ENCOUNTER — Ambulatory Visit (INDEPENDENT_AMBULATORY_CARE_PROVIDER_SITE_OTHER)
Admission: RE | Admit: 2012-11-23 | Discharge: 2012-11-23 | Disposition: A | Discharge status: Home / Self Care | Payer: BC Managed Care – PPO | Source: Ambulatory Visit | Attending: Family Medicine | Admitting: Family Medicine

## 2012-11-23 ENCOUNTER — Encounter: Payer: Self-pay | Admitting: Family Medicine

## 2012-11-23 ENCOUNTER — Ambulatory Visit (INDEPENDENT_AMBULATORY_CARE_PROVIDER_SITE_OTHER): Payer: BC Managed Care – PPO | Admitting: Family Medicine

## 2012-11-23 VITALS — BP 118/82 | HR 89 | Temp 97.3°F | Ht 70.5 in | Wt 224.8 lb

## 2012-11-23 DIAGNOSIS — R9389 Abnormal findings on diagnostic imaging of other specified body structures: Secondary | ICD-10-CM

## 2012-11-23 DIAGNOSIS — R238 Other skin changes: Secondary | ICD-10-CM | POA: Insufficient documentation

## 2012-11-23 DIAGNOSIS — Z0000 Encounter for general adult medical examination without abnormal findings: Secondary | ICD-10-CM

## 2012-11-23 DIAGNOSIS — Z125 Encounter for screening for malignant neoplasm of prostate: Secondary | ICD-10-CM

## 2012-11-23 DIAGNOSIS — L989 Disorder of the skin and subcutaneous tissue, unspecified: Secondary | ICD-10-CM

## 2012-11-23 DIAGNOSIS — Z1211 Encounter for screening for malignant neoplasm of colon: Secondary | ICD-10-CM

## 2012-11-23 DIAGNOSIS — Z8042 Family history of malignant neoplasm of prostate: Secondary | ICD-10-CM

## 2012-11-23 DIAGNOSIS — Z23 Encounter for immunization: Secondary | ICD-10-CM

## 2012-11-23 DIAGNOSIS — J449 Chronic obstructive pulmonary disease, unspecified: Secondary | ICD-10-CM

## 2012-11-23 DIAGNOSIS — N529 Male erectile dysfunction, unspecified: Secondary | ICD-10-CM

## 2012-11-23 DIAGNOSIS — R911 Solitary pulmonary nodule: Secondary | ICD-10-CM | POA: Insufficient documentation

## 2012-11-23 MED ORDER — VARDENAFIL HCL 20 MG PO TABS: 10.0000 mg | ORAL_TABLET | Freq: Every day | ORAL | Status: DC | PRN

## 2012-11-23 MED ORDER — TRIAMCINOLONE ACETONIDE 0.1 % EX CREA: TOPICAL_CREAM | Freq: Two times a day (BID) | CUTANEOUS | Status: DC

## 2012-11-23 NOTE — Assessment & Plan Note (Signed)
See notes re: pulm nodule.

## 2012-11-23 NOTE — Progress Notes (Signed)
CPE- See plan.  Routine anticipatory guidance given to patient.  See health maintenance. Tetanus shot 2014 Shingles shot d/w pt.  Prostate cancer screening. PSA prev elevated, but then resolved on past recheck.  Due for recheck today.  No LUTS.  D/w patient [id number] for colon cancer screening, including IFOB vs. colonoscopy.  Risks and benefits of both were discussed and patient voiced understanding.  Pt elects for: IFOB.  Diet/weight d/w pt, MW:[id number] obesity.  Living will d/w pt.  Wife Clydie Braun designated if incapacitated.   Lipid and sugar prev at goal.    ED noted.  He has a HA with all meds for ED, but levitra worked best with least HA after the fact.  Needs refill.   COPD.  Off symbicort recently, episodic use of SABA w/o SOB usually noted.  Not smoking.    H/o abnormal CT chest, due for f/u CXR.  D/w pt. Done today.    H/o frequent headaches, noted to happen with weather changes.  Improved on beconase nasal spray.    Dry skin noted by patient, diffusely.  Also with itching on the scrotum with skin color changes noted, no testicular pain. Also with itchy lesion on chest.   PMH and SH reviewed  Meds, vitals, and allergies reviewed.   ROS: See HPI.  Otherwise negative.    GEN: nad, alert and oriented HEENT: mucous membranes moist NECK: supple w/o LA CV: rrr. PULM: ctab, no inc wob ABD: soft, +bs EXT: no edema SKIN: dry skin diffusely.  1x2cm blanching excoriated macule notes on L side of anterior chest.  Scrotum with some diffuse hyperpigmentation but no masses or testicular changes noted, testicles not ttp

## 2012-11-23 NOTE — Assessment & Plan Note (Signed)
Use TAC on the chest lesion, appears to be isolated patch of irritated skin.   Use OTC lotion for diffuse dry skin.  Scrotal changes appear to be from chronic irritation, advised to keep area dry as this had been a problem prev.

## 2012-11-23 NOTE — Assessment & Plan Note (Signed)
Restart levitra.

## 2012-11-23 NOTE — Assessment & Plan Note (Signed)
ctab today.  No wheeze.  No sputum.

## 2012-11-23 NOTE — Assessment & Plan Note (Signed)
Routine anticipatory guidance given to patient.  See health maintenance. Tetanus shot 2014 Shingles shot d/w pt.  Prostate cancer screening. PSA prev elevated, but then resolved on past recheck.  Due for recheck today.  No LUTS.  D/w patient [id number] for colon cancer screening, including IFOB vs. colonoscopy.  Risks and benefits of both were discussed and patient voiced understanding.  Pt elects for: IFOB.  Diet/weight d/w pt, YN:[id number] obesity.  Living will d/w pt.  Wife Clydie Braun designated if incapacitated.   Lipid and sugar prev at goal.

## 2012-11-23 NOTE — Assessment & Plan Note (Signed)
Repeat PSA today pending.

## 2012-11-23 NOTE — Patient Instructions (Addendum)
Check with your insurance to see if they will cover the shingles shot. Go to the lab on the way out.  We'll contact you with your lab and xray report. Use the cream on the spot on your left chest wall and it should resolve.  If the headaches don't improve with continued beconase use, then notify me.  Take care.

## 2012-11-23 NOTE — Assessment & Plan Note (Signed)
New lesion, f/u CT chest pending.

## 2012-11-24 ENCOUNTER — Telehealth: Payer: Self-pay | Admitting: Critical Care Medicine

## 2012-11-24 DIAGNOSIS — R911 Solitary pulmonary nodule: Secondary | ICD-10-CM

## 2012-11-24 NOTE — Telephone Encounter (Signed)
I spoke with pt and he stated he had CXR 11/23/12. Showed new nodule in "left lung" per pt. Dr. Para March wants pt to have CT scan done. Pt is wanting to know if this can wait and this scan is expensive. Pt is requesting to have Dr. Delford Field look at his CXR and see what he thinks. Pt is wanting to know if in time this will resolved and just have a repeat CXR instead of CT. Please advise Dr. Delford Field thanks

## 2012-11-25 ENCOUNTER — Other Ambulatory Visit (INDEPENDENT_AMBULATORY_CARE_PROVIDER_SITE_OTHER): Payer: BC Managed Care – PPO

## 2012-11-25 ENCOUNTER — Telehealth: Payer: Self-pay | Admitting: Family Medicine

## 2012-11-25 ENCOUNTER — Other Ambulatory Visit: Payer: BC Managed Care – PPO

## 2012-11-25 DIAGNOSIS — Z1211 Encounter for screening for malignant neoplasm of colon: Secondary | ICD-10-CM

## 2012-11-25 DIAGNOSIS — R911 Solitary pulmonary nodule: Secondary | ICD-10-CM

## 2012-11-25 NOTE — Telephone Encounter (Signed)
Pt had been advised by me and Dr. Delford Field to get CT chest.  Pt declines.  Another option if recheck in ~4-6 weeks by CXR.  Pt consents to that.  Will order.  CT cancelled.

## 2012-11-25 NOTE — Telephone Encounter (Signed)
I spoke with pt. He stated he did receive PW message. He stated he will wait 4-6 weeks for another CXR. And go from there. Will forward to Dr. Delford Field so he is aware of pt decision. Pt needed nothing further.

## 2012-11-25 NOTE — Telephone Encounter (Signed)
I called patient and left msg, He should get a CT scan done as this is a new nodule in the LLL  Another option is to repeat CXR in 4-6 weeks and if no change or LARGER, then repeat CT Chest

## 2012-11-25 NOTE — Telephone Encounter (Signed)
Ok  I am ok with this Make sure CXR is ordered

## 2012-11-26 ENCOUNTER — Other Ambulatory Visit: Payer: Self-pay | Admitting: Family Medicine

## 2012-11-26 DIAGNOSIS — R195 Other fecal abnormalities: Secondary | ICD-10-CM

## 2012-11-26 LAB — FECAL OCCULT BLOOD, IMMUNOCHEMICAL: Fecal Occult Bld: POSITIVE

## 2012-11-26 NOTE — Telephone Encounter (Signed)
Order has been placed for cxr in 4-6 wks and the pt is already aware. Nothing further is needed.

## 2012-11-29 ENCOUNTER — Encounter: Payer: Self-pay | Admitting: *Deleted

## 2012-11-29 ENCOUNTER — Telehealth: Payer: Self-pay | Admitting: Family Medicine

## 2012-11-29 NOTE — Telephone Encounter (Signed)
Letter mailed

## 2012-11-29 NOTE — Telephone Encounter (Signed)
Pt arrived as walk in with questions about recent events.  All had been discussed with patient prev.  There is not change to the plan.  I gave the following written  information to the patient and would have d/w him but he left before I finished with the next patient.  Please send him the following in a letter:  You have blood in your stool, so we have advised you to see the GI clinic.  You have a new pulmonary nodule, so we have advised you to get a CT.  You declined, so we offered a repeat chest xray.  This is scheduled.  You did not need to have your lipids rechecked at the last visit, so we did not check them.  You were advised about this at the time.

## 2012-12-08 ENCOUNTER — Encounter: Payer: Self-pay | Admitting: Radiology

## 2012-12-27 ENCOUNTER — Telehealth: Payer: Self-pay | Admitting: Critical Care Medicine

## 2012-12-27 NOTE — Telephone Encounter (Signed)
This pt needs a CT Chest to f/u on RUL area and Lung nodule  Non contrasted Ct ok

## 2012-12-27 NOTE — Telephone Encounter (Signed)
Called, spoke with pt.  I explained below to him per Dr. Delford Field and reiterated the importance of this follow up.  He verbalized understanding of these results and recs but still declines to schedule OV or come in for cxr at this time..  Pt states he will not be able to get "Dr. Lianne Bushy office" pain in the next month reporting it will take him a few months to get this done.  Pt states when he gets this bill paid, he will call back to schedule an OV with PW with a cxr same day.  He voiced no further questions or concerns at this time.  Will sign off and route to PW as FYI.

## 2012-12-27 NOTE — Telephone Encounter (Signed)
Noted  

## 2012-12-27 NOTE — Telephone Encounter (Signed)
The nodule in the left lower lung is bigger and is 1.7CM in diameter. The right upper lobe area is better So it would be best to follow closely.   A CT Scan is best  But for financial reasons I am ok with Chest xray repeat alone in the next one month and OV same day

## 2012-12-27 NOTE — Telephone Encounter (Signed)
Message copied by Storm Frisk on Mon Dec 27, 2012  9:36 AM ------      Message from: Shan Levans E      Created: Thu Nov 25, 2012 12:04 PM       chk cxr ------

## 2012-12-27 NOTE — Telephone Encounter (Signed)
Called, spoke with pt: (Pls see below phone msg from 11/24/12 for additional information)  Call Documentation    Marcellus Scott, CMA at 11/26/2012 5:38 PM    Status: Signed             Order has been placed for cxr in 4-6 wks and the pt is already aware. Nothing further is needed.        Storm Frisk, MD at 11/25/2012 12:04 PM    Status: Signed             Ok I am ok with this  Make sure CXR is ordered        Tommie Sams, CMA at 11/25/2012 12:03 PM    Status: Signed             I spoke with pt. He stated he did receive PW message. He stated he will wait 4-6 weeks for another CXR. And go from there. Will forward to Dr. Delford Field so he is aware of pt decision. Pt needed nothing further.         Storm Frisk, MD at 11/25/2012 9:12 AM    Status: Signed             I called patient and left msg, He should get a CT scan done as this is a new nodule in the LLL  Another option is to repeat CXR in 4-6 weeks and if no change or LARGER, then repeat CT Chest         Tommie Sams, CMA at 11/24/2012 5:15 PM    Status: Signed             I spoke with pt and he stated he had CXR 11/23/12. Showed new nodule in "left lung" per pt. Dr. Para March wants pt to have CT scan done. Pt is wanting to know if this can wait and this scan is expensive. Pt is requesting to have Dr. Delford Field look at his CXR and see what he thinks. Pt is wanting to know if in time this will resolved and just have a repeat CXR instead of CT. Please advise Dr. Delford Field thanks   ---------  Pt states he would like to still hold off on the CT Chest for now.  He is aware PW recs he do a cxr in 4-6 wks from the last cxr as a follow up, but if area is larger or no change he will need a f/u CT Chest.  Pt states he would also like to hold off on the CXR for now as well.  Pt states he has a bill with "[place]" that he needs to get paid before he can afford having any other tests done reporting that his billed has gotten messed up by Dr. Lianne Bushy  office.  I advised pt to call Dr. Lianne Bushy office and the billing office to have this taken care of.  Pt states he has done this with no success and states, "I will just pay for it."  I ensured pt that he will not have to pay anything at the time the test was done and advised he could set up a payment option with billing and even inquire about pt assistance.  Pt still declined having cxr done or scheduling OV with PW until his bill is paid and was not interesting in calling to inquire about patient assistance.  I once again explained the importance of the f/u cxr.  Pt verbalized understanding of this but  still declining to have it done right now.  Pt states he would call back when he is ready to have it done.  Advised I would inform PW of his request and situation.  He verbalized understanding and voiced no further questions or concerns at this time. Will route to PW as FYI.

## 2013-02-28 ENCOUNTER — Telehealth: Payer: Self-pay | Admitting: Family Medicine

## 2013-02-28 NOTE — Telephone Encounter (Signed)
All of the MDs here could care for him so I don't have a preference.  I would appreciate you calling him back.  Thanks.

## 2013-02-28 NOTE — Telephone Encounter (Signed)
Randall Davis came in to pay off his account and ask if I wold send you a message to see which doctor do you think he should see in this practice. He stated that he would like to see someone around his age. He said to send me the message so I could call him back. He stated that he did not want to talk to any nurse. I told him I have no control of who calls him back but I would let you know what he ask.

## 2013-04-08 ENCOUNTER — Other Ambulatory Visit: Payer: Self-pay | Admitting: Family Medicine

## 2013-04-11 ENCOUNTER — Other Ambulatory Visit: Payer: Self-pay

## 2013-04-11 MED ORDER — ALBUTEROL SULFATE HFA 108 (90 BASE) MCG/ACT IN AERS: 2.0000 | INHALATION_SPRAY | Freq: Four times a day (QID) | RESPIRATORY_TRACT | Status: DC | PRN

## 2013-04-11 NOTE — Telephone Encounter (Signed)
Pt request refill albuterol to [facility]. Left v/m tpt to ck with [facility].

## 2013-05-23 ENCOUNTER — Emergency Department (HOSPITAL_COMMUNITY): Payer: BC Managed Care – PPO

## 2013-05-23 ENCOUNTER — Encounter (HOSPITAL_COMMUNITY): Payer: Self-pay

## 2013-05-23 ENCOUNTER — Inpatient Hospital Stay (HOSPITAL_COMMUNITY)
Admission: EM | Admit: 2013-05-23 | Discharge: 2013-05-26 | DRG: 180 | Disposition: A | Discharge status: Home / Self Care | Payer: BC Managed Care – PPO | Attending: Internal Medicine | Admitting: Internal Medicine

## 2013-05-23 DIAGNOSIS — Z7982 Long term (current) use of aspirin: Secondary | ICD-10-CM

## 2013-05-23 DIAGNOSIS — J449 Chronic obstructive pulmonary disease, unspecified: Secondary | ICD-10-CM | POA: Diagnosis present

## 2013-05-23 DIAGNOSIS — K56609 Unspecified intestinal obstruction, unspecified as to partial versus complete obstruction: Secondary | ICD-10-CM | POA: Diagnosis present

## 2013-05-23 DIAGNOSIS — Z79899 Other long term (current) drug therapy: Secondary | ICD-10-CM

## 2013-05-23 DIAGNOSIS — Z87891 Personal history of nicotine dependence: Secondary | ICD-10-CM

## 2013-05-23 DIAGNOSIS — J4489 Other specified chronic obstructive pulmonary disease: Secondary | ICD-10-CM | POA: Diagnosis present

## 2013-05-23 DIAGNOSIS — R7309 Other abnormal glucose: Secondary | ICD-10-CM | POA: Diagnosis present

## 2013-05-23 DIAGNOSIS — D72829 Elevated white blood cell count, unspecified: Secondary | ICD-10-CM | POA: Diagnosis present

## 2013-05-23 DIAGNOSIS — R911 Solitary pulmonary nodule: Secondary | ICD-10-CM | POA: Diagnosis present

## 2013-05-23 LAB — CBC WITH DIFFERENTIAL/PLATELET
Basophils Absolute: 0 10*3/uL (ref 0.0–0.1)
HCT: 52 % (ref 39.0–52.0)
Lymphocytes Relative: 10 % — ABNORMAL LOW (ref 12–46)
Monocytes Absolute: 0.5 10*3/uL (ref 0.1–1.0)
Neutro Abs: 12.3 10*3/uL — ABNORMAL HIGH (ref 1.7–7.7)
Neutrophils Relative %: 87 % — ABNORMAL HIGH (ref 43–77)
Platelets: 260 10*3/uL (ref 150–400)
RDW: 12.6 % (ref 11.5–15.5)
WBC: 14.2 10*3/uL — ABNORMAL HIGH (ref 4.0–10.5)

## 2013-05-23 LAB — COMPREHENSIVE METABOLIC PANEL
ALT: 24 U/L (ref 0–53)
AST: 17 U/L (ref 0–37)
CO2: 29 mEq/L (ref 19–32)
Chloride: 97 mEq/L (ref 96–112)
GFR calc non Af Amer: 70 mL/min — ABNORMAL LOW (ref >90)
Potassium: 4.3 mEq/L (ref 3.5–5.1)
Sodium: 136 mEq/L (ref 135–145)
Total Bilirubin: 0.5 mg/dL (ref 0.3–1.2)

## 2013-05-23 MED ORDER — LORAZEPAM 2 MG/ML IJ SOLN
1.0000 mg | Freq: Once | INTRAMUSCULAR | Status: AC
  Administered 2013-05-24: 2 mg via INTRAVENOUS
  Filled 2013-05-23: qty 1

## 2013-05-23 MED ORDER — IOHEXOL 300 MG/ML  SOLN
50.0000 mL | Freq: Once | INTRAMUSCULAR | Status: AC | PRN
  Administered 2013-05-23: 50 mL via ORAL

## 2013-05-23 MED ORDER — MORPHINE SULFATE 2 MG/ML IJ SOLN
2.0000 mg | Freq: Once | INTRAMUSCULAR | Status: AC
  Administered 2013-05-23: 2 mg via INTRAVENOUS
  Filled 2013-05-23: qty 1

## 2013-05-23 MED ORDER — SODIUM CHLORIDE 0.9 % IV BOLUS (SEPSIS)
500.0000 mL | Freq: Once | INTRAVENOUS | Status: AC
  Administered 2013-05-23: 21:00:00 via INTRAVENOUS

## 2013-05-23 MED ORDER — LIDOCAINE HCL 2 % EX GEL
CUTANEOUS | Status: AC
  Administered 2013-05-23: 10
  Filled 2013-05-23: qty 10

## 2013-05-23 MED ORDER — ONDANSETRON HCL 4 MG/2ML IJ SOLN
4.0000 mg | Freq: Once | INTRAMUSCULAR | Status: AC
  Administered 2013-05-23: 4 mg via INTRAVENOUS
  Filled 2013-05-23: qty 2

## 2013-05-23 MED ORDER — IOHEXOL 300 MG/ML  SOLN
100.0000 mL | Freq: Once | INTRAMUSCULAR | Status: AC | PRN
  Administered 2013-05-23: 100 mL via INTRAVENOUS

## 2013-05-23 NOTE — ED Notes (Signed)
Pt complains of abd pain since early this morning, no vomiting, has had 2 BM's no diarrhea, pt states that his stomach feels bloated.

## 2013-05-23 NOTE — ED Provider Notes (Signed)
CSN: [id number]     Arrival date & time 05/23/13  2009 History     First MD Initiated Contact with Patient 05/23/13 2035     Chief Complaint  Patient presents with  . Abdominal Pain   HPI  History provided by patient and significant other. The patient is a 62 year old male with long history of past smoking, COPD who presents with worsening sharp lower abdominal pains today. Decreased appetite yesterday and all day today. Pain radiates across bilateral lower abdomen slightly worse in the periumbilical area. Patient had 2 soft brown bowel movements today. No blood or mucus. Patient states he feels as if he "needs to have diarrhea " but I just can't. Patient reports having diaphoresis and hot flash while having bowel movement on the toilet. Denies any nausea vomiting. No other fever, chills or sweats. No chest pain or shortness of breath. No flank pain or urinary symptoms. No radiating back pain.  Patient has 2-4 glasses of liquor (scotch) a week.    Past Medical History  Diagnosis Date  . Former smoker   . COPD (chronic obstructive pulmonary disease)   . Allergy    Past Surgical History  Procedure Laterality Date  . Lung surgery      due to pneumothorax in his 21s (with postoperative changes on L lung noted prev)  . Hemorrhoid surgery    . Video bronchoscopy  03/19/2012    Procedure: VIDEO BRONCHOSCOPY WITH FLUORO;  Surgeon: Storm Frisk, MD;  Location: [facility];  Service: Cardiopulmonary;  Laterality: N/A;   Family History  Problem Relation Age of Onset  . Asthma Mother   . Prostate cancer Father   . Colon cancer Neg Hx   . Allergies Mother    History  Substance Use Topics  . Smoking status: Former Smoker -- 1.50 packs/day for 40 years    Types: Cigarettes    Quit date: 10/20/2001  . Smokeless tobacco: Never Used  . Alcohol Use: Yes     Comment: 1 drink per week    Review of Systems  Constitutional: Positive for appetite change. Negative for fever, chills and  diaphoresis.  Respiratory: Negative for shortness of breath.   Cardiovascular: Negative for chest pain.  Gastrointestinal: Positive for abdominal pain. Negative for nausea, vomiting, diarrhea, constipation, blood in stool and rectal pain.  Genitourinary: Negative for dysuria, frequency, hematuria and flank pain.  Neurological: Negative for dizziness and light-headedness.  All other systems reviewed and are negative.    Allergies  Novocain and Spiriva  Home Medications   Current Outpatient Rx  Name  Route  Sig  Dispense  Refill  . albuterol (PROVENTIL HFA;VENTOLIN HFA) 108 (90 BASE) MCG/ACT inhaler   Inhalation   Inhale 2 puffs into the lungs every 6 (six) hours as needed.   18 g   0   . aspirin-acetaminophen-caffeine (EXCEDRIN MIGRAINE) 250-250-65 MG per tablet   Oral   Take 1 tablet by mouth every 6 (six) hours as needed.         . B Complex-C (B-COMPLEX WITH VITAMIN C) tablet   Oral   Take 1 tablet by mouth daily as needed.          Marland Kitchen BECONASE AQ 42 MCG/SPRAY nasal spray      USE TWO SPRAYS IN EACH NOSTRIL TWICE DAILY   25 g   0   . Naproxen Sodium (ALEVE) 220 MG CAPS   Oral   Take by mouth as needed.         Marland Kitchen  pseudoephedrine (SUDAFED) 30 MG tablet   Oral   Take 30 mg by mouth every 6 (six) hours as needed.         . SYMBICORT 160-4.5 MCG/ACT inhaler      INHALE TWO PUFFS BY MOUTH TWICE DAILY   10.2 g   0   . triamcinolone cream (KENALOG) 0.1 %   Topical   Apply topically 2 (two) times daily.   30 g   1   . vardenafil (LEVITRA) 20 MG tablet   Oral   Take 0.5-1 tablets (10-20 mg total) by mouth daily as needed for erectile dysfunction.   10 tablet   12    BP 150/92  Pulse 92  Temp(Src) 98.2 F (36.8 C) (Oral)  Resp 20  SpO2 94% Physical Exam  Nursing note and vitals reviewed. Constitutional: He is oriented to person, place, and time. He appears well-developed and well-nourished.  HENT:  Head: Normocephalic.  Eyes: Conjunctivae are  normal.  Cardiovascular: Normal rate and regular rhythm.   Pulmonary/Chest: Effort normal and breath sounds normal. No respiratory distress. He has no wheezes. He has no rales.  Abdominal: Soft. There is tenderness in the right lower quadrant, periumbilical area, suprapubic area and left lower quadrant.  Abdomen is mildly distended. There is a large soft reducible midline abdominal hernia. Pain is greatest around the periumbilical area.  Musculoskeletal: Normal range of motion.  Neurological: He is alert and oriented to person, place, and time.  Skin: Skin is warm. No rash noted.  Psychiatric: He has a normal mood and affect. His behavior is normal.    ED Course   Procedures   Results for orders placed during the hospital encounter of 05/23/13  CBC WITH DIFFERENTIAL      Result Value Range   WBC 14.2 (*) 4.0 - 10.5 K/uL   RBC 5.86 (*) 4.22 - 5.81 MIL/uL   Hemoglobin 18.0 (*) 13.0 - 17.0 g/dL   HCT 56.2  13.0 - 86.5 %   MCV 88.7  78.0 - 100.0 fL   MCH 30.7  26.0 - 34.0 pg   MCHC 34.6  30.0 - 36.0 g/dL   RDW 78.4  69.6 - 29.5 %   Platelets 260  150 - 400 K/uL   Neutrophils Relative % 87 (*) 43 - 77 %   Neutro Abs 12.3 (*) 1.7 - 7.7 K/uL   Lymphocytes Relative 10 (*) 12 - 46 %   Lymphs Abs 1.4  0.7 - 4.0 K/uL   Monocytes Relative 3  3 - 12 %   Monocytes Absolute 0.5  0.1 - 1.0 K/uL   Eosinophils Relative 0  0 - 5 %   Eosinophils Absolute 0.0  0.0 - 0.7 K/uL   Basophils Relative 0  0 - 1 %   Basophils Absolute 0.0  0.0 - 0.1 K/uL  COMPREHENSIVE METABOLIC PANEL      Result Value Range   Sodium 136  135 - 145 mEq/L   Potassium 4.3  3.5 - 5.1 mEq/L   Chloride 97  96 - 112 mEq/L   CO2 29  19 - 32 mEq/L   Glucose, Bld 185 (*) 70 - 99 mg/dL   BUN 30 (*) 6 - 23 mg/dL   Creatinine, Ser 2.84  0.50 - 1.35 mg/dL   Calcium 13.2  8.4 - 44.0 mg/dL   Total Protein 7.0  6.0 - 8.3 g/dL   Albumin 4.0  3.5 - 5.2 g/dL   AST 17  0 - 37  U/L   ALT 24  0 - 53 U/L   Alkaline Phosphatase 89  39  - 117 U/L   Total Bilirubin 0.5  0.3 - 1.2 mg/dL   GFR calc non Af Amer 70 (*) >90 mL/min   GFR calc Af Amer 81 (*) >90 mL/min  LIPASE, BLOOD      Result Value Range   Lipase 15  11 - 59 U/L       Ct Abdomen Pelvis W Contrast  05/23/2013   *RADIOLOGY REPORT*  Clinical Data: Worsening abdominal pain  CT ABDOMEN AND PELVIS WITH CONTRAST  Technique:  Multidetector CT imaging of the abdomen and pelvis was performed following the standard protocol during bolus administration of intravenous contrast.  Contrast: OMNIPAQUE IOHEXOL 300 MG/ML  SOLN, 50mL OMNIPAQUE IOHEXOL 300 MG/ML  SOLN  Comparison: None.  Findings: Atelectasis / scar in the lower lobes.  Left lower lobe subpleural bleb noted.  Background emphysema noted.  Normal heart size.  No pericardial or pleural effusion.  No lower lobe pneumonia.  Abdomen:  Liver, gallbladder, biliary system, pancreas, spleen, and adrenal glands within normal limits for age and demonstrate no acute process.  Kidneys demonstrate low density cysts bilaterally measuring 4 cm on the right kidney mid pole and 11 mm in the left kidney mid to lower pole region.  No renal obstruction or hydronephrosis.  Normal symmetric renal excretion.  Atherosclerosis of the aorta.  Negative for aneurysm.  Retroaortic left renal vein noted, normal variant.  No abdominal free fluid, fluid collection, hemorrhage, abscess, or adenopathy.  Distal small bowel dilatation noted with air-fluid levels. Fecalization of the distal small bowel.  These loops appear angulated in the right lower quadrant with a transition point noted within the ileum distally. More distal ileum and colon are decompressed. Findings consistent with a distal small bowel obstruction.  No evidence of significant wall thickening.  No pneumatosis.  No perforation.  Normal retrocecal appendix.  Pelvis:  Scattered colonic diverticulosis.  Normal urinary bladder. No pelvic free fluid, fluid collection, hemorrhage, abscess, or  adenopathy.  Small fat containing right inguinal hernia.  Minor degenerative changes of the spine.  No acute osseous finding.  IMPRESSION: Distal small bowel obstruction pattern with "small bowel fecalization."  Transition point in the right lower quadrant within the ileum.  More distal ileum and colon decompressed.  No free air evident.  Negative for abscess  Incidental renal cysts  Colonic diverticulosis  Fat containing right inguinal hernia   Original Report Authenticated By: Judie Petit. Shick, M.D.     1. Small bowel obstruction     MDM  Patient seen and evaluated. Patient concerned for his symptoms but otherwise appears well in no acute distress.  Patient feeling much better after her low-dose pain medication. Labs show elevated WBC. CT pending.   CT showing signs of small bowel obstruction with transitional point in the right lower quadrant consistent with his lower pains. Patient discussed with attending physician and patient evaluated. We'll plan for surgery consult. We'll also order NG tube.   Spoke with Dr. Daphine Deutscher with Gen. surgery. Given that patient is not having any nausea vomiting or general discomforts at this time does not necessary recommend NG tube. He does request a hospital admissions and they will consult in the morning. Does not feel it likely will more any surgery.   Spoke with Dr., Hospital is. They will see patient and admit. Would also like a UA ordered. Bed request for med surge under team 8.  Angus Seller, PA-C 05/23/13 [phone]

## 2013-05-23 NOTE — ED Notes (Signed)
RECEIVED NOTIFICATION@[id number] 

## 2013-05-23 NOTE — ED Provider Notes (Signed)
Medical screening examination/treatment/procedure(s) were conducted as a shared visit with non-physician practitioner(s) and myself.  I personally evaluated the patient during the encounter  Increasing pain to day.  Initially colicky, now constant.   No Abdominal surgeries.   Exam:  Periumbilical hernia, not incarcerated.  No inguinal hernia.  Diffuse tenderness.  Hyperactive BS.  Plan:  Admit, Surgical consult. NG.  Claudean Kinds, MD 05/23/13 [phone]

## 2013-05-23 NOTE — ED Provider Notes (Deleted)
MSE was initiated and I personally evaluated the patient and placed orders (if any) at  8:23 PM on May 23, 2013.  The patient appears stable so that the remainder of the MSE may be completed by another provider.  Patient presents with worsening sharp lower abdominal pains today. Decreased appetite yesterday and all day today. Pain radiates across bilateral lower abdomen slightly worse in the periumbilical area. Patient had 2 soft brown bowel movements today. No blood or mucus. Patient states he feels as if he "needs to have diarrhea " but I just can't. Patient reports having diaphoresis and hot flash while having bowel movement on the toilet. Denies any nausea vomiting. No other fever, chills or sweats. No chest pain or shortness of breath. No flank pain or urinary symptoms. No radiating back pain.  Patient has 1-2 glasses of wine a week.  Physical Exam Patient is slightly tearful due to concern and fears.  Heart is regular rhythm and rate. Lungs clear bilaterally to auscultation.  Abdomen slightly distended. There is a large soft reducible midline abdominal hernia. Mild to moderate diffuse lower abdominal tenderness. No rebound. Negative Murphy's. No CVA tenderness.   Plan Initial lab work started with CBC, CMP and lipase.  Differential diagnosis may include possible diverticulitis, appendicitis, AAA, cholecystitis, gastroenteritis or colitis.  Angus Seller, PA-C 05/23/13 2030

## 2013-05-24 ENCOUNTER — Encounter (HOSPITAL_COMMUNITY): Payer: Self-pay | Admitting: Internal Medicine

## 2013-05-24 ENCOUNTER — Inpatient Hospital Stay (HOSPITAL_COMMUNITY): Payer: BC Managed Care – PPO

## 2013-05-24 DIAGNOSIS — J449 Chronic obstructive pulmonary disease, unspecified: Secondary | ICD-10-CM

## 2013-05-24 DIAGNOSIS — K56609 Unspecified intestinal obstruction, unspecified as to partial versus complete obstruction: Secondary | ICD-10-CM

## 2013-05-24 DIAGNOSIS — R911 Solitary pulmonary nodule: Secondary | ICD-10-CM

## 2013-05-24 LAB — COMPREHENSIVE METABOLIC PANEL
ALT: 21 U/L (ref 0–53)
AST: 16 U/L (ref 0–37)
Albumin: 3.8 g/dL (ref 3.5–5.2)
Calcium: 9.6 mg/dL (ref 8.4–10.5)
GFR calc Af Amer: >90 mL/min (ref >90)
Glucose, Bld: 168 mg/dL — ABNORMAL HIGH (ref 70–99)
Sodium: 135 mEq/L (ref 135–145)
Total Protein: 6.6 g/dL (ref 6.0–8.3)

## 2013-05-24 LAB — CBC WITH DIFFERENTIAL/PLATELET
Basophils Relative: 0 % (ref 0–1)
HCT: 50.7 % (ref 39.0–52.0)
Hemoglobin: 17.7 g/dL — ABNORMAL HIGH (ref 13.0–17.0)
MCHC: 34.9 g/dL (ref 30.0–36.0)
Monocytes Absolute: 0.9 10*3/uL (ref 0.1–1.0)
Monocytes Relative: 7 % (ref 3–12)
Neutro Abs: 10.9 10*3/uL — ABNORMAL HIGH (ref 1.7–7.7)

## 2013-05-24 LAB — GLUCOSE, CAPILLARY
Glucose-Capillary: 139 mg/dL — ABNORMAL HIGH (ref 70–99)
Glucose-Capillary: 114 mg/dL — ABNORMAL HIGH (ref 70–99)

## 2013-05-24 LAB — URINALYSIS, ROUTINE W REFLEX MICROSCOPIC
Glucose, UA: NEGATIVE mg/dL
Hgb urine dipstick: NEGATIVE
Ketones, ur: NEGATIVE mg/dL
Nitrite: NEGATIVE
Urobilinogen, UA: 0.2 mg/dL (ref 0.0–1.0)

## 2013-05-24 MED ORDER — ONDANSETRON HCL 4 MG PO TABS: 4.0000 mg | ORAL_TABLET | Freq: Four times a day (QID) | ORAL | Status: DC | PRN

## 2013-05-24 MED ORDER — HYDROMORPHONE HCL PF 1 MG/ML IJ SOLN: 1.0000 mg | INTRAMUSCULAR | Status: DC | PRN

## 2013-05-24 MED ORDER — ACETAMINOPHEN 650 MG RE SUPP: 650.0000 mg | Freq: Four times a day (QID) | RECTAL | Status: DC | PRN

## 2013-05-24 MED ORDER — FLUTICASONE PROPIONATE 50 MCG/ACT NA SUSP
2.0000 | Freq: Every day | NASAL | Status: DC
  Administered 2013-05-24 – 2013-05-26 (×3): 2 via NASAL
  Filled 2013-05-24: qty 16

## 2013-05-24 MED ORDER — ALBUTEROL SULFATE HFA 108 (90 BASE) MCG/ACT IN AERS: 2.0000 | INHALATION_SPRAY | Freq: Four times a day (QID) | RESPIRATORY_TRACT | Status: DC | PRN

## 2013-05-24 MED ORDER — BUDESONIDE-FORMOTEROL FUMARATE 160-4.5 MCG/ACT IN AERO
2.0000 | INHALATION_SPRAY | Freq: Two times a day (BID) | RESPIRATORY_TRACT | Status: DC
  Filled 2013-05-24: qty 6

## 2013-05-24 MED ORDER — LORAZEPAM 2 MG/ML IJ SOLN
1.0000 mg | Freq: Once | INTRAMUSCULAR | Status: AC
  Administered 2013-05-24: 1 mg via INTRAVENOUS
  Filled 2013-05-24: qty 1

## 2013-05-24 MED ORDER — ONDANSETRON HCL 4 MG/2ML IJ SOLN
4.0000 mg | Freq: Four times a day (QID) | INTRAMUSCULAR | Status: DC | PRN
  Administered 2013-05-24: 4 mg via INTRAVENOUS
  Filled 2013-05-24: qty 2

## 2013-05-24 MED ORDER — ACETAMINOPHEN 325 MG PO TABS: 650.0000 mg | ORAL_TABLET | Freq: Four times a day (QID) | ORAL | Status: DC | PRN

## 2013-05-24 MED ORDER — SODIUM CHLORIDE 0.9 % IV SOLN
INTRAVENOUS | Status: AC
Start: 2013-05-24 — End: 2013-05-25
  Administered 2013-05-24 (×3): via INTRAVENOUS

## 2013-05-24 MED ORDER — TRIAMCINOLONE ACETONIDE 0.1 % EX CREA
TOPICAL_CREAM | Freq: Two times a day (BID) | CUTANEOUS | Status: DC
  Administered 2013-05-24 – 2013-05-26 (×6): via TOPICAL
  Filled 2013-05-24: qty 15

## 2013-05-24 NOTE — ED Notes (Signed)
Know call from floor nurse call made by ED nurse to floor

## 2013-05-24 NOTE — Progress Notes (Signed)
Patient seen and examined. Admitted after midnight secondary to abd pain and nausea and found to have SBO on CT abdomen. Feeling slightly better. Please referred to dr. Katherene Ponto H&P for further details/info on admission.  Plan: -continue strict NPO and conservative measures. Patient has refused NGT -Surgery on board, appreciate assistance and recommendations -for his COPD will continue home regimen. -unable to contact Dr. Delford Field today; will need to talk with him for further info regarding lung nodule. -Outpatient colonoscopy for screening.   Mozell Haber [phone]

## 2013-05-24 NOTE — H&P (Signed)
[facility] History and Physical  Randall Davis ZOX:[id number] DOB: 04/28/1951 DOA: 05/23/2013  Referring physician: ER physician. PCP: Crawford Givens, MD   Chief Complaint: Abdominal pain.  HPI: Randall Davis is a 62 y.o. male history of COPD presented to the ER because of abdominal pain. Patient's abdominal pain started off around noontime yesterday. Patient had some food initially and subsequent to that patient abdominal pain worsened. He had 2 bowel movements. By evening 5:00 patient's pain worsened. The pain was diffuse. Felt nauseated but had no vomiting. Patient also had fever chills and rigors with sweating. Denies any chest pain or shortness of breath Eventually came to the ER and CT abdomen and pelvis showed small bowel obstruction. Surgeon on call Dr. Daphine Deutscher was consulted by ER physician. At this time patient has been admitted for further management. Patient had similar episode 6 weeks ago which patient states spontaneously resolved.  Review of Systems: As presented in the history of presenting illness, rest negative.  Past Medical History  Diagnosis Date  . Former smoker   . COPD (chronic obstructive pulmonary disease)   . Allergy    Past Surgical History  Procedure Laterality Date  . Lung surgery      due to pneumothorax in his 51s (with postoperative changes on L lung noted prev)  . Hemorrhoid surgery    . Video bronchoscopy  03/19/2012    Procedure: VIDEO BRONCHOSCOPY WITH FLUORO;  Surgeon: Storm Frisk, MD;  Location: [facility];  Service: Cardiopulmonary;  Laterality: N/A;   Social History:  reports that he quit smoking about 11 years ago. His smoking use included Cigarettes. He has a 60 pack-year smoking history. He has never used smokeless tobacco. He reports that  drinks alcohol. He reports that he does not use illicit drugs. Home. where does patient live-- Can do ADLs. Can patient participate in ADLs?  Allergies  Allergen Reactions  . Novocain  (Procaine Hcl)     Intolerant in distant past  . Spiriva (Tiotropium Bromide Monohydrate)     Urinary retention    Family History  Problem Relation Age of Onset  . Asthma Mother   . Prostate cancer Father   . Colon cancer Neg Hx   . Allergies Mother       Prior to Admission medications   Medication Sig Start Date End Date Taking? Authorizing Provider  aspirin-acetaminophen-caffeine (EXCEDRIN MIGRAINE) [phone] MG per tablet Take 1 tablet by mouth every 6 (six) hours as needed.   Yes Historical Provider, MD  BECONASE AQ 42 MCG/SPRAY nasal spray USE TWO SPRAYS IN EACH NOSTRIL TWICE DAILY 04/08/13  Yes Joaquim Nam, MD  Naproxen Sodium (ALEVE) 220 MG CAPS Take 440 mg by mouth as needed (for pain).    Yes Historical Provider, MD  pseudoephedrine (SUDAFED) 30 MG tablet Take 30 mg by mouth every 6 (six) hours as needed.   Yes Historical Provider, MD  SYMBICORT 160-4.5 MCG/ACT inhaler INHALE TWO PUFFS BY MOUTH TWICE DAILY 04/08/13  Yes Joaquim Nam, MD  triamcinolone cream (KENALOG) 0.1 % Apply topically 2 (two) times daily. 11/23/12  Yes Joaquim Nam, MD  albuterol (PROVENTIL HFA;VENTOLIN HFA) 108 (90 BASE) MCG/ACT inhaler Inhale 2 puffs into the lungs every 6 (six) hours as needed. 04/11/13   Joaquim Nam, MD  vardenafil (LEVITRA) 20 MG tablet Take 0.5-1 tablets (10-20 mg total) by mouth daily as needed for erectile dysfunction. 11/23/12   Joaquim Nam, MD   Physical Exam: [facility] Vitals:  05/23/13 2015 05/23/13 2025 05/23/13 2243  BP: 150/92  145/92  Pulse: 92  85  Temp:  98.2 F (36.8 C) 97.9 F (36.6 C)  TempSrc:  Oral Oral  Resp: 20  16  SpO2: 94%  92%     General:  Well-developed and nourished.  Eyes: Anicteric no pallor.  ENT: No discharge from the ears eyes nose mouth.  Neck: No mass felt.  Cardiovascular: S1-S2 heard.  Respiratory: No rhonchi or crepitations.  Abdomen: Mildly distended. Nontender. Bowel sounds are appreciated. No guarding or  rigidity.  Skin: No rash.  Musculoskeletal: No edema.  Psychiatric: Appears normal.  Neurologic: Alert awake oriented to time place and person. Moves all extremities.  Labs on Admission:  Basic Metabolic Panel:  Recent Labs Lab 05/23/13 2035  NA 136  K 4.3  CL 97  CO2 29  GLUCOSE 185*  BUN 30*  CREATININE 1.10  CALCIUM 10.4   Liver Function Tests:  Recent Labs Lab 05/23/13 2035  AST 17  ALT 24  ALKPHOS 89  BILITOT 0.5  PROT 7.0  ALBUMIN 4.0    Recent Labs Lab 05/23/13 2035  LIPASE 15   No results found for this basename: AMMONIA,  in the last 168 hours CBC:  Recent Labs Lab 05/23/13 2035  WBC 14.2*  NEUTROABS 12.3*  HGB 18.0*  HCT 52.0  MCV 88.7  PLT 260   Cardiac Enzymes: No results found for this basename: CKTOTAL, CKMB, CKMBINDEX, TROPONINI,  in the last 168 hours  BNP (last 3 results) No results found for this basename: PROBNP,  in the last 8760 hours CBG: No results found for this basename: GLUCAP,  in the last 168 hours  Radiological Exams on Admission: Ct Abdomen Pelvis W Contrast  05/23/2013   *RADIOLOGY REPORT*  Clinical Data: Worsening abdominal pain  CT ABDOMEN AND PELVIS WITH CONTRAST  Technique:  Multidetector CT imaging of the abdomen and pelvis was performed following the standard protocol during bolus administration of intravenous contrast.  Contrast: OMNIPAQUE IOHEXOL 300 MG/ML  SOLN, 50mL OMNIPAQUE IOHEXOL 300 MG/ML  SOLN  Comparison: None.  Findings: Atelectasis / scar in the lower lobes.  Left lower lobe subpleural bleb noted.  Background emphysema noted.  Normal heart size.  No pericardial or pleural effusion.  No lower lobe pneumonia.  Abdomen:  Liver, gallbladder, biliary system, pancreas, spleen, and adrenal glands within normal limits for age and demonstrate no acute process.  Kidneys demonstrate low density cysts bilaterally measuring 4 cm on the right kidney mid pole and 11 mm in the left kidney mid to lower pole  region.  No renal obstruction or hydronephrosis.  Normal symmetric renal excretion.  Atherosclerosis of the aorta.  Negative for aneurysm.  Retroaortic left renal vein noted, normal variant.  No abdominal free fluid, fluid collection, hemorrhage, abscess, or adenopathy.  Distal small bowel dilatation noted with air-fluid levels. Fecalization of the distal small bowel.  These loops appear angulated in the right lower quadrant with a transition point noted within the ileum distally. More distal ileum and colon are decompressed. Findings consistent with a distal small bowel obstruction.  No evidence of significant wall thickening.  No pneumatosis.  No perforation.  Normal retrocecal appendix.  Pelvis:  Scattered colonic diverticulosis.  Normal urinary bladder. No pelvic free fluid, fluid collection, hemorrhage, abscess, or adenopathy.  Small fat containing right inguinal hernia.  Minor degenerative changes of the spine.  No acute osseous finding.  IMPRESSION: Distal small bowel obstruction pattern with "small  bowel fecalization."  Transition point in the right lower quadrant within the ileum.  More distal ileum and colon decompressed.  No free air evident.  Negative for abscess  Incidental renal cysts  Colonic diverticulosis  Fat containing right inguinal hernia   Original Report Authenticated By: Judie Petit. Miles Costain, M.D.     Assessment/Plan Principal Problem:   SBO (small bowel obstruction) Active Problems:   COPD (chronic obstructive pulmonary disease)   1. Small bowel obstruction - patient will be kept n.p.o. NG tube to suction. Pain relief medications. Further recommendations for surgery. Gentle hydration. 2. COPD - presently not wheezing. Continue inhalers. 3. Mild hyperglycemia - check hemoglobin A1c. 4. Leukocytosis - probably reactionary. Since patient had chills and rigors and sweating check blood cultures.    Code Status: Full code.  Family Communication: Family at the bedside.  Disposition Plan:  Admit to inpatient.    Merci Walthers N. [facility] Pager [phone].  If 7PM-7AM, please contact night-coverage [email or web address] Password [facility] 05/24/2013, 12:41 AM

## 2013-05-24 NOTE — Progress Notes (Signed)
Patient pulled out his NG and refused to have a new NG. 250 ml of brown color of gastric content in canister noted.  PA, K Khorr notified.

## 2013-05-24 NOTE — H&P (Signed)
JOMO FORAND is an 62 y.o. male.   Chief Complaint: Abdominal pain, nausea HPI: 61yr old male who is admitted to the medical service due to abdominal pain and nausea.  Workup in the ER showed a small bowel obstruction.  We are asked to see the patient to give surgical evaluation.  Patient's abdominal pain started off around noontime yesterday. Patient had some food initially and subsequent to that patient abdominal pain worsened. He had 2 bowel movements. By evening 5:00 patient's pain worsened. The pain was diffuse. Felt nauseated but had no vomiting. Patient also had fever chills and rigors with sweating. Denies any chest pain or shortness of breath Eventually came to the ER and CT abdomen and pelvis showed small bowel obstruction.  He denies any history of abdominal surgeries or colonoscopies.  Other then an episode of abdominal pain 5-6 weeks ago he has not had abdominal symptoms.  Of note, he had an episode about 5-6 weeks ago of abdominal pain with fevers and chills.  This resolved relatively quickly so he did not seek medical attention for it.   Past Medical History  Diagnosis Date  . Former smoker   . COPD (chronic obstructive pulmonary disease)   . Allergy     Past Surgical History  Procedure Laterality Date  . Lung surgery      due to pneumothorax in his 99s (with postoperative changes on L lung noted prev)  . Hemorrhoid surgery    . Video bronchoscopy  03/19/2012    Procedure: VIDEO BRONCHOSCOPY WITH FLUORO;  Surgeon: Storm Frisk, MD;  Location: [facility];  Service: Cardiopulmonary;  Laterality: N/A;    Family History  Problem Relation Age of Onset  . Asthma Mother   . Prostate cancer Father   . Colon cancer Neg Hx   . Allergies Mother    Social History:  reports that he quit smoking about 11 years ago. His smoking use included Cigarettes. He has a 60 pack-year smoking history. He has never used smokeless tobacco. He reports that  drinks alcohol. He reports that he  does not use illicit drugs.  Allergies:  Allergies  Allergen Reactions  . Novocain (Procaine Hcl)     Intolerant in distant past  . Spiriva (Tiotropium Bromide Monohydrate)     Urinary retention    Medications Prior to Admission  Medication Sig Dispense Refill  . aspirin-acetaminophen-caffeine (EXCEDRIN MIGRAINE) 250-250-65 MG per tablet Take 1 tablet by mouth every 6 (six) hours as needed.      Marland Kitchen BECONASE AQ 42 MCG/SPRAY nasal spray USE TWO SPRAYS IN EACH NOSTRIL TWICE DAILY  25 g  0  . Naproxen Sodium (ALEVE) 220 MG CAPS Take 440 mg by mouth as needed (for pain).       . pseudoephedrine (SUDAFED) 30 MG tablet Take 30 mg by mouth every 6 (six) hours as needed.      . SYMBICORT 160-4.5 MCG/ACT inhaler INHALE TWO PUFFS BY MOUTH TWICE DAILY  10.2 g  0  . triamcinolone cream (KENALOG) 0.1 % Apply topically 2 (two) times daily.  30 g  1  . albuterol (PROVENTIL HFA;VENTOLIN HFA) 108 (90 BASE) MCG/ACT inhaler Inhale 2 puffs into the lungs every 6 (six) hours as needed.  18 g  0  . vardenafil (LEVITRA) 20 MG tablet Take 0.5-1 tablets (10-20 mg total) by mouth daily as needed for erectile dysfunction.  10 tablet  12    Results for orders placed during the hospital encounter of 05/23/13 (from  the past 48 hour(s))  CBC WITH DIFFERENTIAL     Status: Abnormal   Collection Time    05/23/13  8:35 PM      Result Value Range   WBC 14.2 (*) 4.0 - 10.5 K/uL   RBC 5.86 (*) 4.22 - 5.81 MIL/uL   Hemoglobin 18.0 (*) 13.0 - 17.0 g/dL   HCT 16.1  09.6 - 04.5 %   MCV 88.7  78.0 - 100.0 fL   MCH 30.7  26.0 - 34.0 pg   MCHC 34.6  30.0 - 36.0 g/dL   RDW 40.9  81.1 - 91.4 %   Platelets 260  150 - 400 K/uL   Neutrophils Relative % 87 (*) 43 - 77 %   Neutro Abs 12.3 (*) 1.7 - 7.7 K/uL   Lymphocytes Relative 10 (*) 12 - 46 %   Lymphs Abs 1.4  0.7 - 4.0 K/uL   Monocytes Relative 3  3 - 12 %   Monocytes Absolute 0.5  0.1 - 1.0 K/uL   Eosinophils Relative 0  0 - 5 %   Eosinophils Absolute 0.0  0.0 - 0.7  K/uL   Basophils Relative 0  0 - 1 %   Basophils Absolute 0.0  0.0 - 0.1 K/uL  COMPREHENSIVE METABOLIC PANEL     Status: Abnormal   Collection Time    05/23/13  8:35 PM      Result Value Range   Sodium 136  135 - 145 mEq/L   Potassium 4.3  3.5 - 5.1 mEq/L   Chloride 97  96 - 112 mEq/L   CO2 29  19 - 32 mEq/L   Glucose, Bld 185 (*) 70 - 99 mg/dL   BUN 30 (*) 6 - 23 mg/dL   Creatinine, Ser 7.82  0.50 - 1.35 mg/dL   Calcium 95.6  8.4 - 21.3 mg/dL   Total Protein 7.0  6.0 - 8.3 g/dL   Albumin 4.0  3.5 - 5.2 g/dL   AST 17  0 - 37 U/L   ALT 24  0 - 53 U/L   Alkaline Phosphatase 89  39 - 117 U/L   Total Bilirubin 0.5  0.3 - 1.2 mg/dL   GFR calc non Af Amer 70 (*) >90 mL/min   GFR calc Af Amer 81 (*) >90 mL/min   Comment:            The eGFR has been calculated     using the CKD EPI equation.     This calculation has not been     validated in all clinical     situations.     eGFR's persistently     <90 mL/min signify     possible Chronic Kidney Disease.  LIPASE, BLOOD     Status: None   Collection Time    05/23/13  8:35 PM      Result Value Range   Lipase 15  11 - 59 U/L  URINALYSIS, ROUTINE W REFLEX MICROSCOPIC     Status: Abnormal   Collection Time    05/24/13 12:42 AM      Result Value Range   Color, Urine YELLOW  YELLOW   APPearance CLEAR  CLEAR   Specific Gravity, Urine >1.046 (*) 1.005 - 1.030   pH 6.0  5.0 - 8.0   Glucose, UA NEGATIVE  NEGATIVE mg/dL   Hgb urine dipstick NEGATIVE  NEGATIVE   Bilirubin Urine NEGATIVE  NEGATIVE   Ketones, ur NEGATIVE  NEGATIVE mg/dL   Protein,  ur NEGATIVE  NEGATIVE mg/dL   Urobilinogen, UA 0.2  0.0 - 1.0 mg/dL   Nitrite NEGATIVE  NEGATIVE   Leukocytes, UA NEGATIVE  NEGATIVE   Comment: MICROSCOPIC NOT DONE ON URINES WITH NEGATIVE PROTEIN, BLOOD, LEUKOCYTES, NITRITE, OR GLUCOSE <1000 mg/dL.  COMPREHENSIVE METABOLIC PANEL     Status: Abnormal   Collection Time    05/24/13  1:43 AM      Result Value Range   Sodium 135  135 - 145  mEq/L   Potassium 3.9  3.5 - 5.1 mEq/L   Chloride 97  96 - 112 mEq/L   CO2 26  19 - 32 mEq/L   Glucose, Bld 168 (*) 70 - 99 mg/dL   BUN 27 (*) 6 - 23 mg/dL   Creatinine, Ser 1.61  0.50 - 1.35 mg/dL   Calcium 9.6  8.4 - 09.6 mg/dL   Total Protein 6.6  6.0 - 8.3 g/dL   Albumin 3.8  3.5 - 5.2 g/dL   AST 16  0 - 37 U/L   ALT 21  0 - 53 U/L   Alkaline Phosphatase 82  39 - 117 U/L   Total Bilirubin 0.5  0.3 - 1.2 mg/dL   GFR calc non Af Amer 87 (*) >90 mL/min   GFR calc Af Amer >90  >90 mL/min   Comment:            The eGFR has been calculated     using the CKD EPI equation.     This calculation has not been     validated in all clinical     situations.     eGFR's persistently     <90 mL/min signify     possible Chronic Kidney Disease.  CBC WITH DIFFERENTIAL     Status: Abnormal   Collection Time    05/24/13  1:43 AM      Result Value Range   WBC 14.0 (*) 4.0 - 10.5 K/uL   RBC 5.75  4.22 - 5.81 MIL/uL   Hemoglobin 17.7 (*) 13.0 - 17.0 g/dL   HCT 04.5  40.9 - 81.1 %   MCV 88.2  78.0 - 100.0 fL   MCH 30.8  26.0 - 34.0 pg   MCHC 34.9  30.0 - 36.0 g/dL   RDW 91.4  78.2 - 95.6 %   Platelets 247  150 - 400 K/uL   Neutrophils Relative % 78 (*) 43 - 77 %   Neutro Abs 10.9 (*) 1.7 - 7.7 K/uL   Lymphocytes Relative 15  12 - 46 %   Lymphs Abs 2.1  0.7 - 4.0 K/uL   Monocytes Relative 7  3 - 12 %   Monocytes Absolute 0.9  0.1 - 1.0 K/uL   Eosinophils Relative 0  0 - 5 %   Eosinophils Absolute 0.0  0.0 - 0.7 K/uL   Basophils Relative 0  0 - 1 %   Basophils Absolute 0.0  0.0 - 0.1 K/uL  GLUCOSE, CAPILLARY     Status: Abnormal   Collection Time    05/24/13  6:24 AM      Result Value Range   Glucose-Capillary 164 (*) 70 - 99 mg/dL   Dg Abd 1 View  11/20/3084   *RADIOLOGY REPORT*  Clinical Data:  Small bowel obstruction, follow-up  ABDOMEN - 1 VIEW  Comparison: CT abdomen and pelvis 05/23/2013  Findings: Small amount of excreted contrast material within the urinary bladder. Dilated  small bowel loops in mid abdomen consistent  with small bowel obstruction. Paucity of colonic gas. No bowel wall thickening or pathologic calcifications. Bones appear demineralized.  IMPRESSION: Persistent small bowel dilatation consistent with small bowel obstruction.   Original Report Authenticated By: Ulyses Southward, M.D.   Ct Abdomen Pelvis W Contrast  05/23/2013   *RADIOLOGY REPORT*  Clinical Data: Worsening abdominal pain  CT ABDOMEN AND PELVIS WITH CONTRAST  Technique:  Multidetector CT imaging of the abdomen and pelvis was performed following the standard protocol during bolus administration of intravenous contrast.  Contrast: OMNIPAQUE IOHEXOL 300 MG/ML  SOLN, 50mL OMNIPAQUE IOHEXOL 300 MG/ML  SOLN  Comparison: None.  Findings: Atelectasis / scar in the lower lobes.  Left lower lobe subpleural bleb noted.  Background emphysema noted.  Normal heart size.  No pericardial or pleural effusion.  No lower lobe pneumonia.  Abdomen:  Liver, gallbladder, biliary system, pancreas, spleen, and adrenal glands within normal limits for age and demonstrate no acute process.  Kidneys demonstrate low density cysts bilaterally measuring 4 cm on the right kidney mid pole and 11 mm in the left kidney mid to lower pole region.  No renal obstruction or hydronephrosis.  Normal symmetric renal excretion.  Atherosclerosis of the aorta.  Negative for aneurysm.  Retroaortic left renal vein noted, normal variant.  No abdominal free fluid, fluid collection, hemorrhage, abscess, or adenopathy.  Distal small bowel dilatation noted with air-fluid levels. Fecalization of the distal small bowel.  These loops appear angulated in the right lower quadrant with a transition point noted within the ileum distally. More distal ileum and colon are decompressed. Findings consistent with a distal small bowel obstruction.  No evidence of significant wall thickening.  No pneumatosis.  No perforation.  Normal retrocecal appendix.  Pelvis:  Scattered  colonic diverticulosis.  Normal urinary bladder. No pelvic free fluid, fluid collection, hemorrhage, abscess, or adenopathy.  Small fat containing right inguinal hernia.  Minor degenerative changes of the spine.  No acute osseous finding.  IMPRESSION: Distal small bowel obstruction pattern with "small bowel fecalization."  Transition point in the right lower quadrant within the ileum.  More distal ileum and colon decompressed.  No free air evident.  Negative for abscess  Incidental renal cysts  Colonic diverticulosis  Fat containing right inguinal hernia   Original Report Authenticated By: Judie Petit. Miles Costain, M.D.    Review of Systems  Constitutional: Positive for fever, chills and malaise/fatigue.  HENT: Negative.   Eyes: Negative.   Respiratory: Negative.   Cardiovascular: Negative.   Gastrointestinal: Positive for nausea and abdominal pain. Negative for vomiting, diarrhea, constipation and blood in stool.  Genitourinary: Negative.   Musculoskeletal: Negative.   Skin: Negative.   Neurological: Negative.  Negative for weakness.  Endo/Heme/Allergies: Negative.   Psychiatric/Behavioral: Negative.     Blood pressure 130/87, pulse 86, temperature 98 F (36.7 C), temperature source Oral, resp. rate 18, height 5\' 11"  (1.803 m), weight 225 lb 8.5 oz (102.3 kg), SpO2 97.00%. Physical Exam  Constitutional: He is oriented to person, place, and time. He appears well-developed and well-nourished. No distress.  HENT:  Head: Normocephalic and atraumatic.  Eyes: Conjunctivae are normal. Pupils are equal, round, and reactive to light.  Neck: Normal range of motion. Neck supple.  Cardiovascular: Normal rate and regular rhythm.   Respiratory: Effort normal and breath sounds normal.  GI: He exhibits distension (mild). There is tenderness (mild).  No bowel sounds  Genitourinary:  deferred  Musculoskeletal: Normal range of motion.  Neurological: He is alert and oriented to person, place,  and time.  Skin: Skin is  warm and dry.  Psychiatric: He has a normal mood and affect. His behavior is normal. Thought content normal.     Assessment/Plan 1. SBO: at current the patient's symptoms are a little better but the cause of the SBO is unknown, he has not had any abdominal procedures therefore adhesive disease is less likely.  He has never had a colonoscopy either and will need one after discharge to better evaluate his colon.  For now we recommend conservative management with NPO and IVFs, serial abdominal exams and films.  Will recheck films in the am.  Hopefully this will resolve on its own without surgical intervention.  2.  COPD - followed by Dr. Jonni Sanger 3.  New 17 mm nodular lesion medially at the left lung base seen on CXR - 11/24/2012  Dr. Delford Field was aware of this, but unclear what follow up he has had.  WHITE, ELIZABETH 05/24/2013, 8:44 AM  Agree with above. Need to review what has been done with left lung lesion.  Ovidio Kin, MD, [facility] Pager: [phone] Office phone:  [phone]

## 2013-05-24 NOTE — ED Notes (Signed)
Hospitalitis at bedside talking to pt and family

## 2013-05-24 NOTE — Care Management Note (Signed)
    Page 1 of 1   05/24/2013     11:08:00 AM   CARE MANAGEMENT NOTE 05/24/2013  Patient:  Randall Davis, Randall Davis   Account Number:  [id number]  Date Initiated:  05/24/2013  Documentation initiated by:  Lorenda Ishihara  Subjective/Objective Assessment:   62 yo male admitted with SBO. PTA lived at home with spouse.     Action/Plan:   Home when stable   Anticipated DC Date:  05/27/2013   Anticipated DC Plan:  HOME/SELF CARE         Choice offered to / List presented to:             Status of service:  Completed, signed off Medicare Important Message given?   (If response is "NO", the following Medicare IM given date fields will be blank) Date Medicare IM given:   Date Additional Medicare IM given:    Discharge Disposition:  HOME/SELF CARE  Per UR Regulation:  Reviewed for med. necessity/level of care/duration of stay  If discussed at Long Length of Stay Meetings, dates discussed:    Comments:

## 2013-05-24 NOTE — ED Provider Notes (Signed)
Medical screening examination/treatment/procedure(s) were performed by non-physician practitioner and as supervising physician I was immediately available for consultation/collaboration.  Claudean Kinds, MD 05/24/13 [phone]

## 2013-05-25 ENCOUNTER — Inpatient Hospital Stay (HOSPITAL_COMMUNITY): Payer: BC Managed Care – PPO

## 2013-05-25 LAB — GLUCOSE, CAPILLARY: Glucose-Capillary: 100 mg/dL — ABNORMAL HIGH (ref 70–99)

## 2013-05-25 MED ORDER — PANTOPRAZOLE SODIUM 40 MG PO TBEC
40.0000 mg | DELAYED_RELEASE_TABLET | Freq: Every day | ORAL | Status: DC
  Administered 2013-05-25 – 2013-05-26 (×2): 40 mg via ORAL
  Filled 2013-05-25 (×2): qty 1

## 2013-05-25 MED ORDER — ZOLPIDEM TARTRATE 5 MG PO TABS
5.0000 mg | ORAL_TABLET | Freq: Once | ORAL | Status: AC
  Administered 2013-05-25: 5 mg via ORAL
  Filled 2013-05-25: qty 1

## 2013-05-25 MED ORDER — ALUM & MAG HYDROXIDE-SIMETH 200-200-20 MG/5ML PO SUSP
30.0000 mL | Freq: Once | ORAL | Status: AC
  Administered 2013-05-25: 30 mL via ORAL
  Filled 2013-05-25: qty 30

## 2013-05-25 NOTE — Progress Notes (Signed)
[facility] PROGRESS NOTE  Randall Davis YNW:[id number] DOB: [birth year] DOA: 05/23/2013 PCP: Crawford Givens, MD  Assessment/Plan: 1-Small bowel obstruction -KUB with nearly resolved SBO. Patient feeling better. He had a BM. Surgery following. He is tolerating clear diet. Patient will need a colonoscopy out patient. Patient aware.  2- COPD - Continue inhalers.  3-Mild hyperglycemia -hemoglobin A1c.5.5  4-Leukocytosis - probably reactionary. Blood culture no growth to date.  5-Lung Nodule: Patient does not want to have biopsy. He is aware of lung nodule, Dr Delford Field has refer him for biopsy but he has not follow. He will follow with Dr Delford Field. Patient understand risk for malignancy.    Code Status: full code.  Family Communication: Care discussed with patient.  Disposition Plan: home when stable.    Consultants:  surgery  Procedures:  none  Antibiotics:  none  HPI/Subjective: Feeling better. Had BM. No abdominal pain.   Objective: Filed Vitals:   05/25/13 1331  BP: 122/76  Pulse: 72  Temp: 98 F (36.7 C)  Resp: 16    Intake/Output Summary (Last 24 hours) at 05/25/13 1501 Last data filed at 05/25/13 0959  Gross per 24 hour  Intake   1260 ml  Output      0 ml  Net   1260 ml   Filed Weights   05/24/13 0500  Weight: 102.3 kg (225 lb 8.5 oz)    Exam:   General:  No distress.   Cardiovascular: S 1, S 2 RRR  Respiratory: CTA  Abdomen: Bs present, distended, nt  Musculoskeletal: no edema.   Data Reviewed: Basic Metabolic Panel:  Recent Labs Lab 05/23/13 2035 05/24/13 0143  NA 136 135  K 4.3 3.9  CL 97 97  CO2 29 26  GLUCOSE 185* 168*  BUN 30* 27*  CREATININE 1.10 0.94  CALCIUM 10.4 9.6   Liver Function Tests:  Recent Labs Lab 05/23/13 2035 05/24/13 0143  AST 17 16  ALT 24 21  ALKPHOS 89 82  BILITOT 0.5 0.5  PROT 7.0 6.6  ALBUMIN 4.0 3.8    Recent Labs Lab 05/23/13 2035  LIPASE 15   No results found for this basename:  AMMONIA,  in the last 168 hours CBC:  Recent Labs Lab 05/23/13 2035 05/24/13 0143  WBC 14.2* 14.0*  NEUTROABS 12.3* 10.9*  HGB 18.0* 17.7*  HCT 52.0 50.7  MCV 88.7 88.2  PLT 260 247   Cardiac Enzymes: No results found for this basename: CKTOTAL, CKMB, CKMBINDEX, TROPONINI,  in the last 168 hours BNP (last 3 results) No results found for this basename: PROBNP,  in the last 8760 hours CBG:  Recent Labs Lab 05/24/13 0624 05/24/13 1144 05/24/13 2328 05/25/13 0548 05/25/13 1151  GLUCAP 164* 139* 114* 100* 98    Recent Results (from the past 240 hour(s))  CULTURE, BLOOD (ROUTINE X 2)     Status: None   Collection Time    05/24/13  1:43 AM      Result Value Range Status   Specimen Description BLOOD RIGHT ANTECUBITAL   Final   Special Requests BOTTLES DRAWN AEROBIC AND ANAEROBIC 4CC   Final   Culture  Setup Time     Final   Value: 05/24/2013 05:55     Performed at [facility]   Culture     Final   Value:        BLOOD CULTURE RECEIVED NO GROWTH TO DATE CULTURE WILL BE HELD FOR 5 DAYS BEFORE ISSUING A FINAL NEGATIVE REPORT  Performed at [facility]   Report Status PENDING   Incomplete  CULTURE, BLOOD (ROUTINE X 2)     Status: None   Collection Time    05/24/13  1:48 AM      Result Value Range Status   Specimen Description BLOOD RIGHT ARM   Final   Special Requests BOTTLES DRAWN AEROBIC AND ANAEROBIC 3CC   Final   Culture  Setup Time     Final   Value: 05/24/2013 05:55     Performed at [facility]   Culture     Final   Value:        BLOOD CULTURE RECEIVED NO GROWTH TO DATE CULTURE WILL BE HELD FOR 5 DAYS BEFORE ISSUING A FINAL NEGATIVE REPORT     Performed at [facility]   Report Status PENDING   Incomplete     Studies: Dg Abd 1 View  05/24/2013   *RADIOLOGY REPORT*  Clinical Data:  Small bowel obstruction, follow-up  ABDOMEN - 1 VIEW  Comparison: CT abdomen and pelvis 05/23/2013  Findings: Small amount of excreted contrast  material within the urinary bladder. Dilated small bowel loops in mid abdomen consistent with small bowel obstruction. Paucity of colonic gas. No bowel wall thickening or pathologic calcifications. Bones appear demineralized.  IMPRESSION: Persistent small bowel dilatation consistent with small bowel obstruction.   Original Report Authenticated By: Ulyses Southward, M.D.   Ct Abdomen Pelvis W Contrast  05/23/2013   *RADIOLOGY REPORT*  Clinical Data: Worsening abdominal pain  CT ABDOMEN AND PELVIS WITH CONTRAST  Technique:  Multidetector CT imaging of the abdomen and pelvis was performed following the standard protocol during bolus administration of intravenous contrast.  Contrast: OMNIPAQUE IOHEXOL 300 MG/ML  SOLN, 50mL OMNIPAQUE IOHEXOL 300 MG/ML  SOLN  Comparison: None.  Findings: Atelectasis / scar in the lower lobes.  Left lower lobe subpleural bleb noted.  Background emphysema noted.  Normal heart size.  No pericardial or pleural effusion.  No lower lobe pneumonia.  Abdomen:  Liver, gallbladder, biliary system, pancreas, spleen, and adrenal glands within normal limits for age and demonstrate no acute process.  Kidneys demonstrate low density cysts bilaterally measuring 4 cm on the right kidney mid pole and 11 mm in the left kidney mid to lower pole region.  No renal obstruction or hydronephrosis.  Normal symmetric renal excretion.  Atherosclerosis of the aorta.  Negative for aneurysm.  Retroaortic left renal vein noted, normal variant.  No abdominal free fluid, fluid collection, hemorrhage, abscess, or adenopathy.  Distal small bowel dilatation noted with air-fluid levels. Fecalization of the distal small bowel.  These loops appear angulated in the right lower quadrant with a transition point noted within the ileum distally. More distal ileum and colon are decompressed. Findings consistent with a distal small bowel obstruction.  No evidence of significant wall thickening.  No pneumatosis.  No perforation.   Normal retrocecal appendix.  Pelvis:  Scattered colonic diverticulosis.  Normal urinary bladder. No pelvic free fluid, fluid collection, hemorrhage, abscess, or adenopathy.  Small fat containing right inguinal hernia.  Minor degenerative changes of the spine.  No acute osseous finding.  IMPRESSION: Distal small bowel obstruction pattern with "small bowel fecalization."  Transition point in the right lower quadrant within the ileum.  More distal ileum and colon decompressed.  No free air evident.  Negative for abscess  Incidental renal cysts  Colonic diverticulosis  Fat containing right inguinal hernia   Original Report Authenticated By: Judie Petit. Miles Costain, M.D.  Dg Abd 2 Views  05/25/2013   *RADIOLOGY REPORT*  Clinical Data: Short of breath  ABDOMEN - 2 VIEW  Comparison: 05/24/2013  Findings: There is no free intraperitoneal gas.  Distended small bowel loops have further improved and now only slightly dilated with air-fluid levels.  Contrast is present throughout the colon.  IMPRESSION: Nearly resolved small bowel obstruction pattern.  No free intraperitoneal gas.   Original Report Authenticated By: Jolaine Click, M.D.    Scheduled Meds: . budesonide-formoterol  2 puff Inhalation BID  . fluticasone  2 spray Each Nare Daily  . triamcinolone cream   Topical BID   Continuous Infusions:   Principal Problem:   SBO (small bowel obstruction) Active Problems:   COPD (chronic obstructive pulmonary disease)    Time spent: 35 minutes.     Yuliana Vandrunen  [facility] Pager [phone]. If 7PM-7AM, please contact night-coverage at [email or web address], password [facility] 05/25/2013, 3:01 PM  LOS: 2 days

## 2013-05-25 NOTE — Progress Notes (Signed)
Subjective: Feels a bit better, having some flatus no BM feels less distended.  Objective: Vital signs in last 24 hours: Temp:  [97.5 F (36.4 C)-97.9 F (36.6 C)] 97.9 F (36.6 C) (08/06 0551) Pulse Rate:  [82-91] 82 (08/06 0551) Resp:  [16-18] 16 (08/06 0551) BP: (116-148)/(51-85) 116/51 mmHg (08/06 0551) SpO2:  [90 %-96 %] 94 % (08/06 0551) Last BM Date: 05/22/13 150 emesis recorded, Afebrile, VSS No labs Film shows nearly resolved SBO, there is contrast in the colon,  no free air. Intake/Output from previous day: 08/05 0701 - 08/06 0700 In: 2020 [I.V.:820] Out: 0  Intake/Output this shift:    General appearance: alert, cooperative and no distress GI: soft, non-tender; bowel sounds normal; no masses,  no organomegaly  Lab Results:   Recent Labs  05/23/13 2035 05/24/13 0143  WBC 14.2* 14.0*  HGB 18.0* 17.7*  HCT 52.0 50.7  PLT 260 247    BMET  Recent Labs  05/23/13 2035 05/24/13 0143  NA 136 135  K 4.3 3.9  CL 97 97  CO2 29 26  GLUCOSE 185* 168*  BUN 30* 27*  CREATININE 1.10 0.94  CALCIUM 10.4 9.6   PT/INR No results found for this basename: LABPROT, INR,  in the last 72 hours   Recent Labs Lab 05/23/13 2035 05/24/13 0143  AST 17 16  ALT 24 21  ALKPHOS 89 82  BILITOT 0.5 0.5  PROT 7.0 6.6  ALBUMIN 4.0 3.8     Lipase     Component Value Date/Time   LIPASE 15 05/23/2013 2035     Studies/Results: Dg Abd 1 View  05/24/2013   *RADIOLOGY REPORT*  Clinical Data:  Small bowel obstruction, follow-up  ABDOMEN - 1 VIEW  Comparison: CT abdomen and pelvis 05/23/2013  Findings: Small amount of excreted contrast material within the urinary bladder. Dilated small bowel loops in mid abdomen consistent with small bowel obstruction. Paucity of colonic gas. No bowel wall thickening or pathologic calcifications. Bones appear demineralized.  IMPRESSION: Persistent small bowel dilatation consistent with small bowel obstruction.   Original Report  Authenticated By: Ulyses Southward, M.D.   Ct Abdomen Pelvis W Contrast  05/23/2013   *RADIOLOGY REPORT*  Clinical Data: Worsening abdominal pain  CT ABDOMEN AND PELVIS WITH CONTRAST  Technique:  Multidetector CT imaging of the abdomen and pelvis was performed following the standard protocol during bolus administration of intravenous contrast.  Contrast: OMNIPAQUE IOHEXOL 300 MG/ML  SOLN, 50mL OMNIPAQUE IOHEXOL 300 MG/ML  SOLN  Comparison: None.  Findings: Atelectasis / scar in the lower lobes.  Left lower lobe subpleural bleb noted.  Background emphysema noted.  Normal heart size.  No pericardial or pleural effusion.  No lower lobe pneumonia.  Abdomen:  Liver, gallbladder, biliary system, pancreas, spleen, and adrenal glands within normal limits for age and demonstrate no acute process.  Kidneys demonstrate low density cysts bilaterally measuring 4 cm on the right kidney mid pole and 11 mm in the left kidney mid to lower pole region.  No renal obstruction or hydronephrosis.  Normal symmetric renal excretion.  Atherosclerosis of the aorta.  Negative for aneurysm.  Retroaortic left renal vein noted, normal variant.  No abdominal free fluid, fluid collection, hemorrhage, abscess, or adenopathy.  Distal small bowel dilatation noted with air-fluid levels. Fecalization of the distal small bowel.  These loops appear angulated in the right lower quadrant with a transition point noted within the ileum distally. More distal ileum and colon are decompressed. Findings consistent with a  distal small bowel obstruction.  No evidence of significant wall thickening.  No pneumatosis.  No perforation.  Normal retrocecal appendix.  Pelvis:  Scattered colonic diverticulosis.  Normal urinary bladder. No pelvic free fluid, fluid collection, hemorrhage, abscess, or adenopathy.  Small fat containing right inguinal hernia.  Minor degenerative changes of the spine.  No acute osseous finding.  IMPRESSION: Distal small bowel obstruction  pattern with "small bowel fecalization."  Transition point in the right lower quadrant within the ileum.  More distal ileum and colon decompressed.  No free air evident.  Negative for abscess  Incidental renal cysts  Colonic diverticulosis  Fat containing right inguinal hernia   Original Report Authenticated By: Judie Petit. Miles Costain, M.D.   Dg Abd 2 Views  05/25/2013   *RADIOLOGY REPORT*  Clinical Data: Short of breath  ABDOMEN - 2 VIEW  Comparison: 05/24/2013  Findings: There is no free intraperitoneal gas.  Distended small bowel loops have further improved and now only slightly dilated with air-fluid levels.  Contrast is present throughout the colon.  IMPRESSION: Nearly resolved small bowel obstruction pattern.  No free intraperitoneal gas.   Original Report Authenticated By: Jolaine Click, M.D.    Medications: . budesonide-formoterol  2 puff Inhalation BID  . fluticasone  2 spray Each Nare Daily  . triamcinolone cream   Topical BID    Assessment/Plan 1.  SBO  improving 2.  COPD 3.  17 mm nodular left lung lesion,    Plan:  Clear liquids and continue to mobilize.    LOS: 2 days    Davis,Randall 05/25/2013  Agree with above.  Ovidio Kin, MD, [facility] Pager: [phone] Office phone:  [phone]

## 2013-05-26 ENCOUNTER — Telehealth: Payer: Self-pay | Admitting: Critical Care Medicine

## 2013-05-26 ENCOUNTER — Encounter: Payer: Self-pay | Admitting: Gastroenterology

## 2013-05-26 LAB — GLUCOSE, CAPILLARY
Glucose-Capillary: 100 mg/dL — ABNORMAL HIGH (ref 70–99)
Glucose-Capillary: 104 mg/dL — ABNORMAL HIGH (ref 70–99)

## 2013-05-26 MED ORDER — PANTOPRAZOLE SODIUM 40 MG PO TBEC: 40.0000 mg | DELAYED_RELEASE_TABLET | Freq: Every day | ORAL | Status: DC

## 2013-05-26 NOTE — Discharge Summary (Signed)
Physician Discharge Summary  Randall Davis UJW:[id number] DOB: 08-Jul-1951 DOA: 05/23/2013  PCP: Crawford Givens, MD  Admit date: 05/23/2013 Discharge date: 05/26/2013  Time spent: 35 minutes  Recommendations for Outpatient Follow-up:  1. Needs to follow up with GI for possible colonoscopy.  2. Needs to follow up Dr Delford Field for further evaluation of lung nodule.   Discharge Diagnoses:    SBO (small bowel obstruction)   COPD (chronic obstructive pulmonary disease)   Pleural nodule.    Discharge Condition: stable.  Diet recommendation: Heart Healthy.   Filed Weights   05/24/13 0500  Weight: 102.3 kg (225 lb 8.5 oz)    History of present illness:  Randall Davis is a 62 y.o. male history of COPD presented to the ER because of abdominal pain. Patient's abdominal pain started off around noontime yesterday. Patient had some food initially and subsequent to that patient abdominal pain worsened. He had 2 bowel movements. By evening 5:00 patient's pain worsened. The pain was diffuse. Felt nauseated but had no vomiting. Patient also had fever chills and rigors with sweating. Denies any chest pain or shortness of breath Eventually came to the ER and CT abdomen and pelvis showed small bowel obstruction. Surgeon on call Dr. Daphine Deutscher was consulted by ER physician. At this time patient has been admitted for further management. Patient had similar episode 6 weeks ago which patient states spontaneously resolved.   Hospital Course:  1-Small bowel obstruction -KUB with nearly resolved SBO. Patient feeling better. He had a BM. Surgery following. He is tolerating regular diet. Patient will need a colonoscopy out patient. Made appointment with Dr Arlyce Dice with GI. Patient stable to be discharge.  2- COPD - Continue inhalers.  3-Mild hyperglycemia -hemoglobin A1c.5.5  4-Leukocytosis - probably reactionary. Blood culture no growth to date.  5-Lung Nodule: . He is aware of lung nodule. He will follow with Dr Delford Field.     Procedures:  none  Consultations:  Surgery  Discharge Exam: Filed Vitals:   05/26/13 0539  BP: 119/73  Pulse: 75  Temp: 98.5 F (36.9 C)  Resp: 18    General: no distress.  Cardiovascular: S 1, S 2 Respiratory: CTA Abdomen; soft, NT  Discharge Instructions  Discharge Orders   Future Orders Complete By Expires     Increase activity slowly  As directed         Medication List    STOP taking these medications       ALEVE 220 MG Caps  Generic drug:  Naproxen Sodium      TAKE these medications       albuterol 108 (90 BASE) MCG/ACT inhaler  Commonly known as:  PROVENTIL HFA;VENTOLIN HFA  Inhale 2 puffs into the lungs every 6 (six) hours as needed.     aspirin-acetaminophen-caffeine 250-250-65 MG per tablet  Commonly known as:  EXCEDRIN MIGRAINE  Take 1 tablet by mouth every 6 (six) hours as needed.     BECONASE AQ 42 MCG/SPRAY nasal spray  Generic drug:  beclomethasone  USE TWO SPRAYS IN EACH NOSTRIL TWICE DAILY     pantoprazole 40 MG tablet  Commonly known as:  PROTONIX  Take 1 tablet (40 mg total) by mouth daily.     pseudoephedrine 30 MG tablet  Commonly known as:  SUDAFED  Take 30 mg by mouth every 6 (six) hours as needed.     SYMBICORT 160-4.5 MCG/ACT inhaler  Generic drug:  budesonide-formoterol  INHALE TWO PUFFS BY MOUTH TWICE DAILY  triamcinolone cream 0.1 %  Commonly known as:  KENALOG  Apply topically 2 (two) times daily.     vardenafil 20 MG tablet  Commonly known as:  LEVITRA  Take 0.5-1 tablets (10-20 mg total) by mouth daily as needed for erectile dysfunction.       Allergies  Allergen Reactions  . Novocain (Procaine Hcl)     Intolerant in distant past  . Spiriva (Tiotropium Bromide Monohydrate)     Urinary retention       Follow-up Information   Follow up with Crawford Givens, MD In 1 week.   Contact information:   [address] [place] [address] [phone]        The results of significant  diagnostics from this hospitalization (including imaging, microbiology, ancillary and laboratory) are listed below for reference.    Significant Diagnostic Studies: Dg Abd 1 View  05/24/2013   *RADIOLOGY REPORT*  Clinical Data:  Small bowel obstruction, follow-up  ABDOMEN - 1 VIEW  Comparison: CT abdomen and pelvis 05/23/2013  Findings: Small amount of excreted contrast material within the urinary bladder. Dilated small bowel loops in mid abdomen consistent with small bowel obstruction. Paucity of colonic gas. No bowel wall thickening or pathologic calcifications. Bones appear demineralized.  IMPRESSION: Persistent small bowel dilatation consistent with small bowel obstruction.   Original Report Authenticated By: Ulyses Southward, M.D.   Ct Abdomen Pelvis W Contrast  05/23/2013   *RADIOLOGY REPORT*  Clinical Data: Worsening abdominal pain  CT ABDOMEN AND PELVIS WITH CONTRAST  Technique:  Multidetector CT imaging of the abdomen and pelvis was performed following the standard protocol during bolus administration of intravenous contrast.  Contrast: OMNIPAQUE IOHEXOL 300 MG/ML  SOLN, 50mL OMNIPAQUE IOHEXOL 300 MG/ML  SOLN  Comparison: None.  Findings: Atelectasis / scar in the lower lobes.  Left lower lobe subpleural bleb noted.  Background emphysema noted.  Normal heart size.  No pericardial or pleural effusion.  No lower lobe pneumonia.  Abdomen:  Liver, gallbladder, biliary system, pancreas, spleen, and adrenal glands within normal limits for age and demonstrate no acute process.  Kidneys demonstrate low density cysts bilaterally measuring 4 cm on the right kidney mid pole and 11 mm in the left kidney mid to lower pole region.  No renal obstruction or hydronephrosis.  Normal symmetric renal excretion.  Atherosclerosis of the aorta.  Negative for aneurysm.  Retroaortic left renal vein noted, normal variant.  No abdominal free fluid, fluid collection, hemorrhage, abscess, or adenopathy.  Distal small bowel  dilatation noted with air-fluid levels. Fecalization of the distal small bowel.  These loops appear angulated in the right lower quadrant with a transition point noted within the ileum distally. More distal ileum and colon are decompressed. Findings consistent with a distal small bowel obstruction.  No evidence of significant wall thickening.  No pneumatosis.  No perforation.  Normal retrocecal appendix.  Pelvis:  Scattered colonic diverticulosis.  Normal urinary bladder. No pelvic free fluid, fluid collection, hemorrhage, abscess, or adenopathy.  Small fat containing right inguinal hernia.  Minor degenerative changes of the spine.  No acute osseous finding.  IMPRESSION: Distal small bowel obstruction pattern with "small bowel fecalization."  Transition point in the right lower quadrant within the ileum.  More distal ileum and colon decompressed.  No free air evident.  Negative for abscess  Incidental renal cysts  Colonic diverticulosis  Fat containing right inguinal hernia   Original Report Authenticated By: Judie Petit. Miles Costain, M.D.   Dg Abd 2 Views  05/25/2013   *RADIOLOGY REPORT*  Clinical Data: Short of breath  ABDOMEN - 2 VIEW  Comparison: 05/24/2013  Findings: There is no free intraperitoneal gas.  Distended small bowel loops have further improved and now only slightly dilated with air-fluid levels.  Contrast is present throughout the colon.  IMPRESSION: Nearly resolved small bowel obstruction pattern.  No free intraperitoneal gas.   Original Report Authenticated By: Jolaine Click, M.D.    Microbiology: Recent Results (from the past 240 hour(s))  CULTURE, BLOOD (ROUTINE X 2)     Status: None   Collection Time    05/24/13  1:43 AM      Result Value Range Status   Specimen Description BLOOD RIGHT ANTECUBITAL   Final   Special Requests BOTTLES DRAWN AEROBIC AND ANAEROBIC 4CC   Final   Culture  Setup Time     Final   Value: 05/24/2013 05:55     Performed at [facility]   Culture     Final   Value:         BLOOD CULTURE RECEIVED NO GROWTH TO DATE CULTURE WILL BE HELD FOR 5 DAYS BEFORE ISSUING A FINAL NEGATIVE REPORT     Performed at [facility]   Report Status PENDING   Incomplete  CULTURE, BLOOD (ROUTINE X 2)     Status: None   Collection Time    05/24/13  1:48 AM      Result Value Range Status   Specimen Description BLOOD RIGHT ARM   Final   Special Requests BOTTLES DRAWN AEROBIC AND ANAEROBIC 3CC   Final   Culture  Setup Time     Final   Value: 05/24/2013 05:55     Performed at [facility]   Culture     Final   Value:        BLOOD CULTURE RECEIVED NO GROWTH TO DATE CULTURE WILL BE HELD FOR 5 DAYS BEFORE ISSUING A FINAL NEGATIVE REPORT     Performed at [facility]   Report Status PENDING   Incomplete     Labs: Basic Metabolic Panel:  Recent Labs Lab 05/23/13 2035 05/24/13 0143  NA 136 135  K 4.3 3.9  CL 97 97  CO2 29 26  GLUCOSE 185* 168*  BUN 30* 27*  CREATININE 1.10 0.94  CALCIUM 10.4 9.6   Liver Function Tests:  Recent Labs Lab 05/23/13 2035 05/24/13 0143  AST 17 16  ALT 24 21  ALKPHOS 89 82  BILITOT 0.5 0.5  PROT 7.0 6.6  ALBUMIN 4.0 3.8    Recent Labs Lab 05/23/13 2035  LIPASE 15   No results found for this basename: AMMONIA,  in the last 168 hours CBC:  Recent Labs Lab 05/23/13 2035 05/24/13 0143  WBC 14.2* 14.0*  NEUTROABS 12.3* 10.9*  HGB 18.0* 17.7*  HCT 52.0 50.7  MCV 88.7 88.2  PLT 260 247   Cardiac Enzymes: No results found for this basename: CKTOTAL, CKMB, CKMBINDEX, TROPONINI,  in the last 168 hours BNP: BNP (last 3 results) No results found for this basename: PROBNP,  in the last 8760 hours CBG:  Recent Labs Lab 05/25/13 0548 05/25/13 1151 05/25/13 1748 05/26/13 0009 05/26/13 0534  GLUCAP 100* 98 110* 104* 100*       Signed:  REGALADO,BELKYS  [facility] 05/26/2013, 11:36 AM

## 2013-05-26 NOTE — Telephone Encounter (Signed)
I spoke with pt. I advised him he will need to request this from the hospitalitis and express he is wanting this done through them. He voiced his understanding and needed nothing further

## 2013-05-26 NOTE — Progress Notes (Signed)
Discharge instructions reviewed with patient, vital signs are stable, patient is tolerating diet without complaints of nausea or vomiting, patient with loose bowel movement today, no complaints of pain, patient to follow up with is primary doctor Stanford Breed RN 05-26-2013 11:49pm

## 2013-05-26 NOTE — Progress Notes (Signed)
  Subjective: Feels better wants real food, doesn't want to talk about full liquids.  He wants Pulmonary to see him here, but I told him that was not likely to happen.  He had multiple BM's yesterday.    Objective: Vital signs in last 24 hours: Temp:  [98 F (36.7 C)-98.6 F (37 C)] 98.5 F (36.9 C) (08/07 0539) Pulse Rate:  [72-78] 75 (08/07 0539) Resp:  [16-20] 18 (08/07 0539) BP: (119-145)/(73-90) 119/73 mmHg (08/07 0539) SpO2:  [94 %-95 %] 94 % (08/07 0539) Last BM Date: 06-20-2013 PO 60 ml recorded 9 stools recorded. Afebrile, VSS NO labs  Intake/Output from previous day: 06/21/23 0701 - 08/07 0700 In: 60 [P.O.:60] Out: -  Intake/Output this shift:    General appearance: alert, cooperative, no distress and up in chair. GI: soft, non-tender; bowel sounds normal; no masses,  no organomegaly and he says this is about the normal size for his abdomen.  Lab Results:   Recent Labs  05/23/13 2035 05/24/13 0143  WBC 14.2* 14.0*  HGB 18.0* 17.7*  HCT 52.0 50.7  PLT 260 247    BMET  Recent Labs  05/23/13 2035 05/24/13 0143  NA 136 135  K 4.3 3.9  CL 97 97  CO2 29 26  GLUCOSE 185* 168*  BUN 30* 27*  CREATININE 1.10 0.94  CALCIUM 10.4 9.6   PT/INR No results found for this basename: LABPROT, INR,  in the last 72 hours   Recent Labs Lab 05/23/13 2035 05/24/13 0143  AST 17 16  ALT 24 21  ALKPHOS 89 82  BILITOT 0.5 0.5  PROT 7.0 6.6  ALBUMIN 4.0 3.8     Lipase     Component Value Date/Time   LIPASE 15 05/23/2013 2035     Studies/Results: Dg Abd 2 Views  06/20/2013   *RADIOLOGY REPORT*  Clinical Data: Short of breath  ABDOMEN - 2 VIEW  Comparison: 05/24/2013  Findings: There is no free intraperitoneal gas.  Distended small bowel loops have further improved and now only slightly dilated with air-fluid levels.  Contrast is present throughout the colon.  IMPRESSION: Nearly resolved small bowel obstruction pattern.  No free intraperitoneal gas.   Original  Report Authenticated By: Jolaine Click, M.D.    Medications: . budesonide-formoterol  2 puff Inhalation BID  . fluticasone  2 spray Each Nare Daily  . pantoprazole  40 mg Oral Daily  . triamcinolone cream   Topical BID    Assessment/Plan 1. SBO improving  2. COPD  3. 17 mm nodular left lung lesion,    Plan:  I will give him a low fiber diet and see how he does.  If he tolerates he can go home from our standpoint. He is walking frequently in the halls.  LOS: 3 days    JENNINGS,WILLARD 05/26/2013  Agree with above. Tolerating regular food.  I assume he will go home later today. I have stressed to the patient that he needs GI follow up.  He has been reluctant to get medical follow up for problems that he has had. There is no reason to follow up with [place] unless a surgical cause of the obstruction is found.  Ovidio Kin, MD, [facility] Pager: [phone] Office phone:  [phone]

## 2013-05-27 ENCOUNTER — Ambulatory Visit (INDEPENDENT_AMBULATORY_CARE_PROVIDER_SITE_OTHER)
Admission: RE | Admit: 2013-05-27 | Discharge: 2013-05-27 | Disposition: A | Discharge status: Home / Self Care | Payer: BC Managed Care – PPO | Source: Ambulatory Visit | Attending: Family Medicine | Admitting: Family Medicine

## 2013-05-27 ENCOUNTER — Encounter: Payer: Self-pay | Admitting: Family Medicine

## 2013-05-27 ENCOUNTER — Ambulatory Visit (INDEPENDENT_AMBULATORY_CARE_PROVIDER_SITE_OTHER): Payer: BC Managed Care – PPO | Admitting: Family Medicine

## 2013-05-27 VITALS — BP 114/80 | HR 70 | Temp 97.5°F | Wt 216.5 lb

## 2013-05-27 DIAGNOSIS — K56609 Unspecified intestinal obstruction, unspecified as to partial versus complete obstruction: Secondary | ICD-10-CM

## 2013-05-27 DIAGNOSIS — R911 Solitary pulmonary nodule: Secondary | ICD-10-CM

## 2013-05-27 DIAGNOSIS — R7989 Other specified abnormal findings of blood chemistry: Secondary | ICD-10-CM

## 2013-05-27 DIAGNOSIS — F411 Generalized anxiety disorder: Secondary | ICD-10-CM

## 2013-05-27 DIAGNOSIS — F418 Other specified anxiety disorders: Secondary | ICD-10-CM

## 2013-05-27 LAB — CBC WITH DIFFERENTIAL/PLATELET
Basophils Absolute: 0.1 10*3/uL (ref 0.0–0.1)
Basophils Relative: 0.6 % (ref 0.0–3.0)
Eosinophils Absolute: 0.4 10*3/uL (ref 0.0–0.7)
Hemoglobin: 17.7 g/dL — ABNORMAL HIGH (ref 13.0–17.0)
Lymphocytes Relative: 24.2 % (ref 12.0–46.0)
Monocytes Relative: 10.4 % (ref 3.0–12.0)
Neutro Abs: 6.5 10*3/uL (ref 1.4–7.7)
Neutrophils Relative %: 61.1 % (ref 43.0–77.0)
RBC: 5.81 Mil/uL (ref 4.22–5.81)
RDW: 13.2 % (ref 11.5–14.6)

## 2013-05-27 MED ORDER — ALBUTEROL SULFATE HFA 108 (90 BASE) MCG/ACT IN AERS: 2.0000 | INHALATION_SPRAY | Freq: Four times a day (QID) | RESPIRATORY_TRACT | Status: DC | PRN

## 2013-05-27 NOTE — Patient Instructions (Addendum)
Go to the lab on the way out.  We'll contact you with your lab and xray report. Keep the GI appointment.  Use the albuterol if needed.  If you need it more than a few times a month, then let me know.  Take care.

## 2013-05-27 NOTE — Progress Notes (Signed)
SBO f/u.  WBC up as inpatient.  No fevers.  BM this AM.  Inpatient notes reviewed.  No abd pain. Due for f/u CBC.  No blood in stool.  GI f/u pending.   Pulm nodule.  D/w pt about getting CXR today.  He has needed symbicort rarely and has good exercise tolerance with rare wheeze.  Is out of SABA.  Feels anxious with symbicort use.  Discussed options.   Anxiety in hospital.  Not out of hospital.  This appears to be situational.  He may need extra treatment for anxiety if inpatient again.    Meds, vitals, and allergies reviewed.   ROS: See HPI.  Otherwise, noncontributory.  GEN: nad, alert and oriented HEENT: mucous membranes moist NECK: supple w/o LA CV: rrr. PULM: ctab, no inc wob ABD: soft, +bs EXT: no edema SKIN: no acute rash Speech and affect wnl.

## 2013-05-29 DIAGNOSIS — F418 Other specified anxiety disorders: Secondary | ICD-10-CM | POA: Insufficient documentation

## 2013-05-29 NOTE — Assessment & Plan Note (Signed)
CXR noted, will d/w Dr. Delford Field from pulmonary.

## 2013-05-29 NOTE — Assessment & Plan Note (Signed)
Apparently resolved and WBC improved. No fevers.  BM this AM. No abd pain. No blood in stool.  GI f/u pending.

## 2013-05-30 ENCOUNTER — Encounter: Payer: Self-pay | Admitting: Gastroenterology

## 2013-05-30 ENCOUNTER — Ambulatory Visit (INDEPENDENT_AMBULATORY_CARE_PROVIDER_SITE_OTHER): Payer: BC Managed Care – PPO | Admitting: Gastroenterology

## 2013-05-30 VITALS — BP 122/80 | HR 84 | Ht 70.5 in | Wt 218.6 lb

## 2013-05-30 DIAGNOSIS — K56609 Unspecified intestinal obstruction, unspecified as to partial versus complete obstruction: Secondary | ICD-10-CM

## 2013-05-30 DIAGNOSIS — R195 Other fecal abnormalities: Secondary | ICD-10-CM

## 2013-05-30 DIAGNOSIS — Z1211 Encounter for screening for malignant neoplasm of colon: Secondary | ICD-10-CM

## 2013-05-30 LAB — CULTURE, BLOOD (ROUTINE X 2): Culture: NO GROWTH

## 2013-05-30 MED ORDER — NA SULFATE-K SULFATE-MG SULF 17.5-3.13-1.6 GM/177ML PO SOLN: 1.0000 | Freq: Once | ORAL | Status: DC

## 2013-05-30 MED ORDER — ALPRAZOLAM 1 MG PO TABS: ORAL_TABLET | ORAL | Status: DC

## 2013-05-30 NOTE — Assessment & Plan Note (Signed)
Plan colonoscopy.  I will prescribe 1 mg Xanax that patient can take prior to the procedure for anticipated anxiety.

## 2013-05-30 NOTE — Progress Notes (Signed)
History of Present Illness: pleasant 62 year old white male with history of COPD, recent hospital admission for a small bowel obstruction of  unclear etiology referred for evaluation of Hemoccult-positive stool. This was noted on routine testing. The patient has no lower GI complaints including change of bowel habits, abdominal pain or rectal bleeding. He has occasional pyrosis.    Past Medical History  Diagnosis Date  . Former smoker   . COPD (chronic obstructive pulmonary disease)   . Allergy   . Anxiety disorder    Past Surgical History  Procedure Laterality Date  . Lung surgery      due to pneumothorax in his 31s (with postoperative changes on L lung noted prev)  . Hemorrhoid surgery    . Video bronchoscopy  03/19/2012    Procedure: VIDEO BRONCHOSCOPY WITH FLUORO;  Surgeon: Storm Frisk, MD;  Location: [facility];  Service: Cardiopulmonary;  Laterality: N/A;   family history includes Allergies in his mother; Asthma in his mother; and Prostate cancer in his father.  There is no history of Colon cancer. Current Outpatient Prescriptions  Medication Sig Dispense Refill  . acetaminophen (TYLENOL) 325 MG tablet Take 650 mg by mouth every 6 (six) hours as needed for pain.      Marland Kitchen albuterol (PROVENTIL HFA;VENTOLIN HFA) 108 (90 BASE) MCG/ACT inhaler Inhale 2 puffs into the lungs every 6 (six) hours as needed.  18 g  2  . BECONASE AQ 42 MCG/SPRAY nasal spray USE TWO SPRAYS IN EACH NOSTRIL TWICE DAILY  25 g  0  . pseudoephedrine (SUDAFED) 30 MG tablet Take 30 mg by mouth every 6 (six) hours as needed.      . triamcinolone cream (KENALOG) 0.1 % Apply topically 2 (two) times daily.  30 g  1  . vardenafil (LEVITRA) 20 MG tablet Take 0.5-1 tablets (10-20 mg total) by mouth daily as needed for erectile dysfunction.  10 tablet  12  . Na Sulfate-K Sulfate-Mg Sulf (SUPREP BOWEL PREP) SOLN Take 1 kit by mouth once.  1 Bottle  0   No current facility-administered medications for this visit.    Allergies as of 05/30/2013 - Review Complete 05/30/2013  Allergen Reaction Noted  . Novocain (procaine hcl)  02/20/2012  . Spiriva (tiotropium bromide monohydrate)  02/20/2012    reports that he quit smoking about 11 years ago. His smoking use included Cigarettes. He has a 60 pack-year smoking history. He has never used smokeless tobacco. He reports that  drinks alcohol. He reports that he does not use illicit drugs.     Review of Systems: he complains of anxiety in anticipation of Dr. Visits and procedures.Pertinent positive and negative review of systems were noted in the above HPI section. All other review of systems were otherwise negative.  Vital signs were reviewed in today's medical record Physical Exam: General: Well developed , well nourished, no acute distress Skin: anicteric Head: Normocephalic and atraumatic Eyes:  sclerae anicteric, EOMI Ears: Normal auditory acuity Mouth: No deformity or lesions Neck: Supple, no masses or thyromegaly Lungs: there a few expiratory wheezes Heart: Regular rate and rhythm; no murmurs, rubs or bruits Abdomen: Soft, non tender and non distended. No masses, hepatosplenomegaly or hernias noted. Normal Bowel sounds Rectal:deferred Musculoskeletal: Symmetrical with no gross deformities  Skin: No lesions on visible extremities Pulses:  Normal pulses noted Extremities: No clubbing, cyanosis, edema or deformities noted Neurological: Alert oriented x 4, grossly nonfocal Cervical Nodes:  No significant cervical adenopathy Inguinal Nodes: No significant inguinal adenopathy Psychological:  Alert and cooperative. Normal mood and affect

## 2013-05-30 NOTE — Patient Instructions (Addendum)

## 2013-05-30 NOTE — Assessment & Plan Note (Signed)
Etiology not determined. Clinically resolved.

## 2013-06-03 ENCOUNTER — Ambulatory Visit (AMBULATORY_SURGERY_CENTER): Payer: BC Managed Care – PPO | Admitting: Gastroenterology

## 2013-06-03 ENCOUNTER — Encounter: Payer: Self-pay | Admitting: Gastroenterology

## 2013-06-03 ENCOUNTER — Other Ambulatory Visit: Payer: Self-pay | Admitting: Gastroenterology

## 2013-06-03 VITALS — BP 118/79 | HR 57 | Temp 97.1°F | Resp 17 | Ht 70.0 in | Wt 218.0 lb

## 2013-06-03 DIAGNOSIS — Z1282 Encounter for screening for malignant neoplasm of nervous system: Secondary | ICD-10-CM

## 2013-06-03 DIAGNOSIS — D126 Benign neoplasm of colon, unspecified: Secondary | ICD-10-CM

## 2013-06-03 DIAGNOSIS — D649 Anemia, unspecified: Secondary | ICD-10-CM

## 2013-06-03 MED ORDER — SODIUM CHLORIDE 0.9 % IV SOLN: 500.0000 mL | INTRAVENOUS | Status: DC

## 2013-06-03 NOTE — Progress Notes (Signed)
Procedure ends, to recovery, report given and VSS. 

## 2013-06-03 NOTE — Patient Instructions (Addendum)

## 2013-06-03 NOTE — Progress Notes (Signed)
Pt. States he had intestinal blockage 05/23/13 was seen in ED at [facility].  Had NG tube inserted.  Admitted to hospital overnight.  Pt. State he was Advised that his colon was twisted.

## 2013-06-03 NOTE — Progress Notes (Signed)
Patient did not experience any of the following events: a burn prior to discharge; a fall within the facility; wrong site/side/patient/procedure/implant event; or a hospital transfer or hospital admission upon discharge from the facility. (G8907) Patient did not have preoperative order for IV antibiotic SSI prophylaxis. (G8918)  

## 2013-06-03 NOTE — Progress Notes (Signed)
Called to room to assist during endoscopic procedure.  Patient ID and intended procedure confirmed with present staff. Received instructions for my participation in the procedure from the performing physician.  

## 2013-06-03 NOTE — Op Note (Signed)
[facility] [address] [place], [address]   COLONOSCOPY PROCEDURE REPORT  PATIENT: Randall Davis, Randall Davis  MR#: [id number] BIRTHDATE: [birth year] , 62  yrs. old GENDER: Male ENDOSCOPIST: Louis Meckel, MD REFERRED BY: PROCEDURE DATE:  06/03/2013 PROCEDURE:   Colonoscopy with snare polypectomy and Colonoscopy with cold biopsy polypectomy First Screening Colonoscopy - Avg.  risk and is 50 yrs.  old or older Yes.  Prior Negative Screening - Now for repeat screening. N/A  History of Adenoma - Now for follow-up colonoscopy & has been > or = to 3 yrs.  N/A  Polyps Removed Today? Yes. ASA CLASS:   Class II INDICATIONS:average risk screening. history of Hemoccult-positive stool MEDICATIONS: MAC sedation, administered by CRNA and Propofol (Diprivan) 270 mg IV  DESCRIPTION OF PROCEDURE:   After the risks benefits and alternatives of the procedure were thoroughly explained, informed consent was obtained.  A digital rectal exam revealed no abnormalities of the rectum.   The LB [id number]  endoscope was introduced through the anus and advanced to the cecum, which was identified by both the appendix and ileocecal valve. No adverse events experienced.   The quality of the prep was excellent using Suprep  The instrument was then slowly withdrawn as the colon was fully examined.      COLON FINDINGS: There were 2 polyps in the area of the splenic flexure measuring 2 and 4 mm, respectively.  These were removed with the cold biopsy forceps and polypectomy snare respectively, submitted to pathology. In the descending colon there were 3 discrete polyps measuring approximately 3 mm each.  They were removed via cold polypectomy snare.  2 polyps were retrieved and submitted to pathology.  In the sigmoid colon there was a 3 mm sessile polyp removed with cold polypectomy snare and submitted to pathology.   in the sigmoid colon.   The colon mucosa was otherwise normal. None of  the polyps was bleeding. Retroflexed views revealed no abnormalities. The time to cecum=3 minutes 33 seconds.  Withdrawal time=13 minutes 14 seconds.  The scope was withdrawn and the procedure completed. COMPLICATIONS: There were no complications.  ENDOSCOPIC IMPRESSION: 1.   multiple nonbleeding polyps 2.  reticulosis  RECOMMENDATIONS: 1.  If the polyp(s) removed today are proven to be adenomatous (pre-cancerous) polyps, you will need a colonoscopy in 3 years. Otherwise you should continue to follow colorectal cancer screening guidelines for "routine risk" patients with a colonoscopy in 10 years.  You will receive a letter within 1-2 weeks with the results of your biopsy as well as final recommendations.  Please call my office if you have not received a letter after 3 weeks. 2.  Followup hemeoccults 7-10 days   eSigned:  Louis Meckel, MD 06/03/2013 12:11 PM   cc:   PATIENT NAME:  Randall Davis, Randall Davis MR#: [id number]

## 2013-06-03 NOTE — Progress Notes (Signed)
hemoccults cards given in recovery room upon discharge.

## 2013-06-06 ENCOUNTER — Telehealth: Payer: Self-pay

## 2013-06-06 NOTE — Telephone Encounter (Signed)
  Follow up Call-  Call back number 06/03/2013  Post procedure Call Back phone  # [phone]  Permission to leave phone message Yes     Patient questions:  Do you have a fever, pain , or abdominal swelling? no Pain Score  0 *  Have you tolerated food without any problems? yes  Have you been able to return to your normal activities? yes  Do you have any questions about your discharge instructions: Diet   no Medications  no Follow up visit  no  Do you have questions or concerns about your Care? no  Actions: * If pain score is 4 or above: No action needed, pain <4.

## 2013-06-07 ENCOUNTER — Telehealth: Payer: Self-pay | Admitting: *Deleted

## 2013-06-07 NOTE — Telephone Encounter (Signed)
lmomtcb for pt to schedule him for an OV with PW in [place] in Sept.

## 2013-06-07 NOTE — Telephone Encounter (Signed)
Message copied by Gweneth Dimitri D on Tue Jun 07, 2013 11:29 AM ------      Message from: Shan Levans E      Created: Mon Jun 06, 2013 12:07 PM       Need to get this pt in for an OV in September [place] office      ----- Message -----         From: Storm Frisk, MD         Sent: 05/31/2013   8:37 AM           To: Storm Frisk, MD, Joaquim Nam, MD            i will try to get him back and convince him of a CT            $$$ is the reason he does not want to get imaged            pw      ----- Message -----         From: Joaquim Nam, MD         Sent: 05/29/2013   1:38 PM           To: Storm Frisk, MD            We were able to get his f/u cxr and I would like your input.  The CXR was to follow the pulmonary nodule.  A CT would be useful, but I don't think the patient will go for it.  What is your preference on following this?            I put in my own order to have it come back to me.  I didn't cancel your preexisting CXR order.              Thanks.  Hope you are doing well.            Clelia Croft                   ------

## 2013-06-08 NOTE — Telephone Encounter (Signed)
lmomtcb for pt 

## 2013-06-10 NOTE — Telephone Encounter (Signed)
Called, spoke with pt.  States he cannot talk at this minute and requesting we call back in a little while.

## 2013-06-10 NOTE — Telephone Encounter (Signed)
Called, spoke with pt.  Pt aware that Dr. Para March sent cxr results to Dr. Delford Field.  Advised PW would like him to come in for OV in Sept to discuss this.  Pt states he cannot come in until the middle of November bc he doesn't have any vacation days.  He would like to know if PW will call him to discuss further.  Dr. Delford Field, pls advise if you will call pt or prefer him to schedule OV in Nov?  Thank you.

## 2013-06-13 NOTE — Telephone Encounter (Signed)
Just keep nov appt

## 2013-06-13 NOTE — Telephone Encounter (Signed)
Pt aware. Nothing further needed. Recall set

## 2013-06-15 ENCOUNTER — Other Ambulatory Visit: Payer: Self-pay | Admitting: Family Medicine

## 2013-06-15 ENCOUNTER — Telehealth: Payer: Self-pay | Admitting: Family Medicine

## 2013-06-15 DIAGNOSIS — F418 Other specified anxiety disorders: Secondary | ICD-10-CM

## 2013-06-15 NOTE — Telephone Encounter (Signed)
Randall Davis stopped by and wanted me to let you know that when he had his colonscopy they gave him a mild dose of xanax. He states that he did very well on this and would like to use this again when he has to do another colonscopy. He said he just wanted you to know this.

## 2013-06-15 NOTE — Telephone Encounter (Signed)
Noted. Thanks.

## 2013-06-16 ENCOUNTER — Encounter: Payer: Self-pay | Admitting: Gastroenterology

## 2013-08-07 ENCOUNTER — Other Ambulatory Visit: Payer: Self-pay | Admitting: Family Medicine

## 2013-08-08 NOTE — Telephone Encounter (Signed)
Pt request status of beconase refill;advised sent to [facility]; pt will pick up.

## 2013-09-23 ENCOUNTER — Ambulatory Visit (INDEPENDENT_AMBULATORY_CARE_PROVIDER_SITE_OTHER): Payer: BC Managed Care – PPO | Admitting: Family Medicine

## 2013-09-23 ENCOUNTER — Encounter: Payer: Self-pay | Admitting: Family Medicine

## 2013-09-23 VITALS — BP 138/96 | HR 97 | Temp 98.5°F | Wt 224.5 lb

## 2013-09-23 DIAGNOSIS — H04129 Dry eye syndrome of unspecified lacrimal gland: Secondary | ICD-10-CM

## 2013-09-23 DIAGNOSIS — F411 Generalized anxiety disorder: Secondary | ICD-10-CM

## 2013-09-23 DIAGNOSIS — R238 Other skin changes: Secondary | ICD-10-CM

## 2013-09-23 DIAGNOSIS — F418 Other specified anxiety disorders: Secondary | ICD-10-CM

## 2013-09-23 DIAGNOSIS — J069 Acute upper respiratory infection, unspecified: Secondary | ICD-10-CM

## 2013-09-23 DIAGNOSIS — L989 Disorder of the skin and subcutaneous tissue, unspecified: Secondary | ICD-10-CM

## 2013-09-23 DIAGNOSIS — H04123 Dry eye syndrome of bilateral lacrimal glands: Secondary | ICD-10-CM

## 2013-09-23 DIAGNOSIS — R195 Other fecal abnormalities: Secondary | ICD-10-CM

## 2013-09-23 MED ORDER — ALPRAZOLAM 1 MG PO TABS: 0.5000 mg | ORAL_TABLET | Freq: Every evening | ORAL | Status: DC | PRN

## 2013-09-23 MED ORDER — AMOXICILLIN 875 MG PO TABS: 875.0000 mg | ORAL_TABLET | Freq: Two times a day (BID) | ORAL | Status: DC

## 2013-09-23 NOTE — Patient Instructions (Addendum)
Start the amoxil today.  Shirlee Limerick will call about your referral. Use the xanax sparingly.  Take care.

## 2013-09-23 NOTE — Progress Notes (Signed)
Pre-visit discussion using our clinic review tool. No additional management support is needed unless otherwise documented below in the visit note.  Mult complaints.   Started with ST 1 week ago, then cough.  Vomited once.  cough continued.  Rhinorrhea.  Discolored sputum. No fevers now, did have one about 1 week ago.  Ear and sinus pain better after using peroxide in his ear.    Anxiety.  Asking about using xanax.  He wasn't asking for regular use, asking for breakthrough use.  He gets frustrated and was asking about options. Asking about #10/6 months.    Persistent eye dryness, esp in AM.  Using renew eye drops frequently.  No vision loss.    He has an itchy area on the R arm.  No specific treatment yet.    I encouraged f/u with pulmonary per prev recs.    Meds, vitals, and allergies reviewed.   ROS: See HPI.  Otherwise, noncontributory.  GEN: nad, alert and oriented HEENT: mucous membranes moist, tm w/o erythema, nasal exam w/o erythema, clear discharge noted,  OP with cobblestoning, R frontal sinus mildly ttp NECK: supple w/o LA CV: rrr.   PULM: ctab, no inc wob EXT: no edema SKIN: no acute rash but irritated area that doesn't appear infected noted on R arm, nondermatomal.  Speech fluent.

## 2013-09-25 DIAGNOSIS — H04123 Dry eye syndrome of bilateral lacrimal glands: Secondary | ICD-10-CM | POA: Insufficient documentation

## 2013-09-25 DIAGNOSIS — J069 Acute upper respiratory infection, unspecified: Secondary | ICD-10-CM | POA: Insufficient documentation

## 2013-09-25 NOTE — Assessment & Plan Note (Signed)
Ctab,nontoxic.  Given the sinus tenderness, would start amoxil and f/u prn.

## 2013-09-25 NOTE — Assessment & Plan Note (Signed)
Small amount of xanax for patient to use with sedation caution.  Use rarely.  If needed more than 1-2 times a month we'll likely need to change his meds around.  He agrees.

## 2013-09-25 NOTE — Assessment & Plan Note (Signed)
Refer to ophtho clinic.  He agrees.

## 2013-09-25 NOTE — Assessment & Plan Note (Signed)
Looks excoriated, would use topical OTC hydrocortisone and f/u prn.  Doesn't appear infected.

## 2013-10-19 ENCOUNTER — Other Ambulatory Visit: Payer: Self-pay

## 2013-10-19 NOTE — Telephone Encounter (Signed)
Pt was seen 09/23/13 with URI and sinus; pt said finished antibiotic and symptoms were cleared; was out in cold weather and got chilled and has restarted with runny nose with clear mucus, head congestion, some facial pain,prod cough with yellow phlegm. No wheezing, SOB,or CP. Pt request refill amoxicillin to [facility].Please advise.

## 2013-10-20 MED ORDER — AMOXICILLIN 875 MG PO TABS: 875.0000 mg | ORAL_TABLET | Freq: Two times a day (BID) | ORAL | Status: AC

## 2013-10-20 NOTE — Telephone Encounter (Signed)
I refilled electronically- fu if not improved

## 2013-10-21 NOTE — Telephone Encounter (Signed)
Pt.notified

## 2013-11-11 ENCOUNTER — Telehealth: Payer: Self-pay | Admitting: Critical Care Medicine

## 2013-11-16 NOTE — Telephone Encounter (Signed)
Sent letter to sched ROV appt w/ PW.  Satira Anis

## 2013-11-20 ENCOUNTER — Other Ambulatory Visit: Payer: Self-pay | Admitting: Family Medicine

## 2014-01-19 ENCOUNTER — Telehealth: Payer: Self-pay | Admitting: Family Medicine

## 2014-01-19 NOTE — Telephone Encounter (Signed)
Patient Information:  Caller Name: Randall Davis  Phone: [phone]  Patient: Randall Davis, Randall Davis  Gender: Male  DOB: January 09, 1951  Age: 63 Years  PCP: Randall Davis) [facility])  Office Follow Up:  Does the office need to follow up with this patient?: No  Instructions For The Office: N/A  RN Note:  told Randall Davis that his diet changes could be responsible for his change in stools; has cut out most carbs/starches to help with weight loss; suggested adding starches back into his diet and taking Probiotics since he is unable to eat yogurt, until his stools become more firm; also said that his current diet could be contributing to the recent gassiness  Symptoms  Reason For Call & Symptoms: c/o loose stools for a couple of wks; denies any watery or runny stools; has 2-3 daily, normally in the mornings; normally goes 1-2x in the mornings, but is not loose; says its hard to hold; c/o gas; says he used to take yogurt, but thinks he is lactose intolerant and has stopped taking dairy; says he feels fine otherwise; has cut out starches and eating greens and fiber rich foods  Reviewed Health History In EMR: Yes  Reviewed Medications In EMR: Yes  Reviewed Allergies In EMR: Yes  Reviewed Surgeries / Procedures: Yes  Date of Onset of Symptoms: Unknown  Guideline(s) Used:  Diarrhea  Disposition Per Guideline:   See Within 3 Days in Office  Reason For Disposition Reached:   Diarrhea persists > 7 days  Advice Given:  Reassurance:  In healthy adults, new-onset diarrhea is usually caused by a viral infection of the intestines, which you can treat at home. Diarrhea is the body's way of getting rid of the infection. Here are some tips on how to keep ahead of the fluid losses.  Fluids:  Drink more fluids, at least 8-10 glasses (8 oz or 240 ml) daily.  For example: sports drinks, diluted fruit juices, soft drinks.  Supplement this with saltine crackers or soups to make certain that you are getting  sufficient fluid and salt to meet your body's needs.  Nutrition:  Maintaining some food intake during episodes of diarrhea is important.  Ideal initial foods include boiled starches/cereals (e.g., potatoes, rice, noodles, wheat, oats) with a small amount of salt to taste.  Other acceptable foods include: bananas, yogurt, crackers, soup.  As your stools return to normal consistency, resume a normal diet.  Diarrhea Medication  - Imodium AD:   Helps reduce diarrhea.  Expected Course:  Viral diarrhea lasts 4-7 days. Always worse on days 1 and 2.  Call Back If:  Signs of dehydration occur (e.g., no urine for more than 12 hours, very dry mouth, lightheaded, etc.)  You become worse.  RN Overrode Recommendation:  Follow Up With Office Later  will try diet changes and call back for appt

## 2014-06-30 ENCOUNTER — Telehealth: Payer: Self-pay

## 2014-06-30 ENCOUNTER — Encounter (INDEPENDENT_AMBULATORY_CARE_PROVIDER_SITE_OTHER): Payer: Self-pay

## 2014-06-30 ENCOUNTER — Ambulatory Visit (INDEPENDENT_AMBULATORY_CARE_PROVIDER_SITE_OTHER): Payer: BC Managed Care – PPO | Admitting: Family Medicine

## 2014-06-30 ENCOUNTER — Encounter: Payer: Self-pay | Admitting: Family Medicine

## 2014-06-30 VITALS — BP 124/80 | HR 80 | Temp 98.1°F | Wt 217.5 lb

## 2014-06-30 DIAGNOSIS — R197 Diarrhea, unspecified: Secondary | ICD-10-CM

## 2014-06-30 LAB — COMPREHENSIVE METABOLIC PANEL
ALK PHOS: 81 U/L (ref 39–117)
ALT: 18 U/L (ref 0–53)
AST: 14 U/L (ref 0–37)
Albumin: 4.2 g/dL (ref 3.5–5.2)
BUN: 23 mg/dL (ref 6–23)
CO2: 29 meq/L (ref 19–32)
CREATININE: 1.04 mg/dL (ref 0.50–1.35)
Calcium: 9.4 mg/dL (ref 8.4–10.5)
Chloride: 101 mEq/L (ref 96–112)
GLUCOSE: 79 mg/dL (ref 70–99)
Potassium: 4.2 mEq/L (ref 3.5–5.3)
Sodium: 137 mEq/L (ref 135–145)
Total Bilirubin: 0.4 mg/dL (ref 0.2–1.2)
Total Protein: 6.6 g/dL (ref 6.0–8.3)

## 2014-06-30 MED ORDER — BECLOMETHASONE DIPROP MONOHYD 42 MCG/SPRAY NA SUSP: NASAL | Status: DC

## 2014-06-30 MED ORDER — ALBUTEROL SULFATE HFA 108 (90 BASE) MCG/ACT IN AERS: 2.0000 | INHALATION_SPRAY | Freq: Four times a day (QID) | RESPIRATORY_TRACT | Status: DC | PRN

## 2014-06-30 NOTE — Telephone Encounter (Signed)
Pt already scheduled appt today at 2:30 pm with Dr Damita Dunnings; for 6 weeks pt has loose stools with slight abd pain when using restroom. Pt has had fever on and off; no blood in stool.pt trying to watch diet and taking Immodium daily. Pt had colonoscopy 6 months ago. If pt condition changes or worsens prior to appt pt will cb.

## 2014-06-30 NOTE — Progress Notes (Signed)
Pre visit review using our clinic review tool, if applicable. No additional management support is needed unless otherwise documented below in the visit note.  6 weeks of diarrhea/loose stools.  Stool changes are worse with chicken and fish.  No blood in stool. No vomiting.  No fevers.  No black stools.  No new meds.   Usually with 4-5 BMs per day over the last few weeks.  Some lower abd pain, but that is mild.  Gas is clearly malodorous, different from typical.  Imodium didn't help.  H/o SBO, s/p colonoscopy prev.  No burning with urination.  No travel, other than to the [facility].  No undercooked meals/meats.  Eating fruit daily, has fiber daily in diet.  Veggies in diet.   "Bug bite" on R lower medial shin.  Present for about 1 week.  Not draining, not sore, healing.   Meds, vitals, and allergies reviewed.   ROS: See HPI.  Otherwise, noncontributory.  GEN: nad, alert and oriented HEENT: mucous membranes moist NECK: supple w/o LA CV: rrr PULM: ctab, no inc wob ABD: soft, +bs, not ttp, no rebound.  EXT: no edema SKIN: no acute rash but mildly irritated small lesion on R lower medial shin, doesn't appear infected.

## 2014-06-30 NOTE — Patient Instructions (Signed)
Keep taking the imodium for now.  Let me get the labs back and then we'll go from there.  Go to the lab on the way out.  We'll contact you with your lab report. Take care.

## 2014-07-01 LAB — CBC WITH DIFFERENTIAL/PLATELET
Basophils Absolute: 0.1 10*3/uL (ref 0.0–0.1)
Basophils Relative: 1 % (ref 0–1)
EOS PCT: 2 % (ref 0–5)
Eosinophils Absolute: 0.2 10*3/uL (ref 0.0–0.7)
HEMATOCRIT: 48.3 % (ref 39.0–52.0)
HEMOGLOBIN: 16.5 g/dL (ref 13.0–17.0)
LYMPHS ABS: 2.5 10*3/uL (ref 0.7–4.0)
LYMPHS PCT: 30 % (ref 12–46)
MCH: 30.6 pg (ref 26.0–34.0)
MCHC: 34.2 g/dL (ref 30.0–36.0)
MCV: 89.6 fL (ref 78.0–100.0)
MONO ABS: 0.9 10*3/uL (ref 0.1–1.0)
MONOS PCT: 11 % (ref 3–12)
Neutro Abs: 4.7 10*3/uL (ref 1.7–7.7)
Neutrophils Relative %: 56 % (ref 43–77)
PLATELETS: 317 10*3/uL (ref 150–400)
RBC: 5.39 MIL/uL (ref 4.22–5.81)
RDW: 13.3 % (ref 11.5–15.5)
WBC: 8.4 10*3/uL (ref 4.0–10.5)

## 2014-07-02 DIAGNOSIS — R197 Diarrhea, unspecified: Secondary | ICD-10-CM | POA: Insufficient documentation

## 2014-07-02 NOTE — Assessment & Plan Note (Signed)
Unclear source, would check basic labs and stool studies, d/w pt.  Wouldn't start intervention now, other than continue prn imodium. D/w pt.  Nontoxic.   Not an acute abd.  He agrees with plan.

## 2014-07-04 NOTE — Addendum Note (Signed)
Addended by: Ellamae Sia on: 07/04/2014 02:26 PM   Modules accepted: Orders

## 2014-07-07 NOTE — Addendum Note (Signed)
Addended by: Ellamae Sia on: 07/07/2014 09:14 AM   Modules accepted: Orders

## 2014-07-08 LAB — C. DIFFICILE GDH AND TOXIN A/B
C. difficile GDH: NOT DETECTED
C. difficile Toxin A/B: NOT DETECTED

## 2014-07-10 ENCOUNTER — Encounter: Payer: Self-pay | Admitting: Family Medicine

## 2014-07-10 DIAGNOSIS — J309 Allergic rhinitis, unspecified: Secondary | ICD-10-CM | POA: Insufficient documentation

## 2014-07-11 LAB — STOOL CULTURE

## 2014-07-13 ENCOUNTER — Other Ambulatory Visit: Payer: Self-pay | Admitting: Family Medicine

## 2014-07-13 DIAGNOSIS — R197 Diarrhea, unspecified: Secondary | ICD-10-CM

## 2014-07-18 ENCOUNTER — Telehealth: Payer: Self-pay

## 2014-07-18 MED ORDER — BECLOMETHASONE DIPROP MONOHYD 42 MCG/SPRAY NA SUSP: NASAL | Status: DC

## 2014-07-18 NOTE — Telephone Encounter (Signed)
Randall Davis notified as instructed by telephone. Lars Mage stated that the dose is over the quantity limit so will need the PA and she will send this for review. Called Katie at [facility] and requested that she change the script to 2 bottles. Prescription correct.

## 2014-07-18 NOTE — Telephone Encounter (Signed)
Randall Davis stated that she received the prior authorization paperwork on beconase. Randall Davis wants to verify that patient only needs one bottle per month and one bottle will last him 25 days. There are 200 sprays per bottle and the directions show that he is using 8 sprays a day. [name] needs verification on the bottles needed per month. Please advise.

## 2014-07-18 NOTE — Telephone Encounter (Signed)
Okay to change to 2 bottles per rx.  Please send in.  Thanks.

## 2014-07-18 NOTE — Telephone Encounter (Signed)
Randall Davis with [facility] clinical review left v/m requesting cb about prior auth about beconase and clarify quantity requested per month; instructions is use 2 sprays each nostril bid which does not correspond to quantity listed of 25/6 days. 25 gm vial should last 25 days. Randall Davis request cb.

## 2014-07-19 ENCOUNTER — Telehealth: Payer: Self-pay | Admitting: Family Medicine

## 2014-07-19 NOTE — Telephone Encounter (Signed)
Called patient to set up GI appt and he said he doesn't want to go to see the GI Dr and to cancel the referral.

## 2014-07-19 NOTE — Telephone Encounter (Signed)
I can't cancel the order.  It will need to be done by network admin or GI.

## 2014-07-20 NOTE — Telephone Encounter (Signed)
GI Referral cancelled

## 2014-07-24 NOTE — Telephone Encounter (Signed)
Received note from [facility] [employer], showing that medication does not require a review at this time. Note sent to be scanned into EMR.

## 2015-01-10 ENCOUNTER — Telehealth: Payer: Self-pay | Admitting: *Deleted

## 2015-01-10 NOTE — Telephone Encounter (Signed)
Called to inquire about flu vaccine. Patient declined.

## 2015-01-31 ENCOUNTER — Telehealth: Payer: Self-pay

## 2015-01-31 NOTE — Telephone Encounter (Signed)
Patient notified as instructed by telephone and verbalized understanding. . Patient stated that it is an all natural supplement that he buys at [facility] or [facility]. Patient stated that it is expensive and someone told him if his doctor would write a script for it his insurance company may pay for it. Patient stated that it really helps his joint pain better than tylenol or anything else that he has tried.

## 2015-01-31 NOTE — Telephone Encounter (Signed)
Pt left v/m requesting cb; spoke with pt and he is having soreness from arthritis in neck; pt has been taking OTC Intraflex Joint Supplement (no mg) instructions take 1 tab 3 - 4 times daily as needed for joint pain. Pt said OTC is expensive and pt request Intraflex sent to [facility] as a prescription so ins will help pay for med.Pt request cb. Last annual exam 11/23/2012.No future appt scheduled.

## 2015-01-31 NOTE — Telephone Encounter (Signed)
Pt left v/m requesting cb (that was entire message). Left v/m requesting cb.

## 2015-01-31 NOTE — Telephone Encounter (Signed)
Intraflex isn't a med. What was he proposing?  Thanks .

## 2015-02-01 NOTE — Telephone Encounter (Signed)
I can't write for that.  He'll have to pay out of pocket.  Thanks.

## 2015-02-01 NOTE — Telephone Encounter (Signed)
Patient advised.

## 2015-03-21 ENCOUNTER — Encounter: Payer: Self-pay | Admitting: Primary Care

## 2015-03-21 ENCOUNTER — Ambulatory Visit (INDEPENDENT_AMBULATORY_CARE_PROVIDER_SITE_OTHER): Payer: BLUE CROSS/BLUE SHIELD | Admitting: Primary Care

## 2015-03-21 ENCOUNTER — Telehealth: Payer: Self-pay | Admitting: Primary Care

## 2015-03-21 ENCOUNTER — Ambulatory Visit (INDEPENDENT_AMBULATORY_CARE_PROVIDER_SITE_OTHER)
Admission: RE | Admit: 2015-03-21 | Discharge: 2015-03-21 | Disposition: A | Discharge status: Home / Self Care | Payer: BLUE CROSS/BLUE SHIELD | Source: Ambulatory Visit | Attending: Primary Care | Admitting: Primary Care

## 2015-03-21 VITALS — BP 118/80 | HR 81 | Temp 98.6°F | Ht 70.5 in | Wt 221.8 lb

## 2015-03-21 DIAGNOSIS — R059 Cough, unspecified: Secondary | ICD-10-CM

## 2015-03-21 DIAGNOSIS — R05 Cough: Secondary | ICD-10-CM

## 2015-03-21 MED ORDER — PREDNISONE 20 MG PO TABS: ORAL_TABLET | ORAL | Status: DC

## 2015-03-21 MED ORDER — AZITHROMYCIN 250 MG PO TABS: ORAL_TABLET | ORAL | Status: DC

## 2015-03-21 MED ORDER — GUAIFENESIN-CODEINE 100-10 MG/5ML PO SYRP: 5.0000 mL | ORAL_SOLUTION | Freq: Three times a day (TID) | ORAL | Status: DC | PRN

## 2015-03-21 NOTE — Progress Notes (Signed)
Pre visit review using our clinic review tool, if applicable. No additional management support is needed unless otherwise documented below in the visit note. 

## 2015-03-21 NOTE — Telephone Encounter (Signed)
Notified patient of xray results which was negative for pneumonia. He is still insistent on antibiotics so Zpak was sent with instructions to start in a few days if symptoms worsen. I provided him with a script for prednisone and robitussin AC earlier this afternoon. He verbalized understanding.

## 2015-03-21 NOTE — Progress Notes (Signed)
Subjective:    Patient ID: Randall Davis, male    DOB: September 20, 1951, 64 y.o.   MRN: [id number]  HPI  Randall Davis is a 64 year old male who presents today with a chief complaint of sore throat and cough. His sore throat began Tuesday with rhinorrhea; his cough began last night. He's been taking Excedrin, beconase AQ, using his albuterol inhaler and taking cough drops without resolve in symptoms. He also reports dizziness, feeling febrile, headaches. He reports his cough typically turns into a bad infection and would like treatment with antibiotics before it "gets out of hand".  Review of Systems  Constitutional: Positive for chills and fatigue. Negative for fever.  HENT: Positive for rhinorrhea, sneezing and sore throat. Negative for congestion, ear pain and sinus pressure.   Respiratory: Positive for cough. Negative for shortness of breath.   Cardiovascular: Negative for chest pain.  Musculoskeletal: Negative for myalgias.  Neurological: Positive for dizziness.       Past Medical History  Diagnosis Date  . Former smoker   . COPD (chronic obstructive pulmonary disease)   . Allergy   . Anxiety disorder     History   Social History  . Marital Status: Married    Spouse Name: N/A  . Number of Children: 2  . Years of Education: N/A   Occupational History  . Truck Geophysicist/field seismologist    Social History Main Topics  . Smoking status: Former Smoker -- 1.50 packs/day for 40 years    Types: Cigarettes    Quit date: 10/20/2001  . Smokeless tobacco: Never Used  . Alcohol Use: Yes     Comment: 1 drink per week  . Drug Use: No  . Sexual Activity: Not on file   Other Topics Concern  . Not on file   Social History Narrative   Married 2000   2 Pensions consultant, former Programme researcher, broadcasting/film/video work   Engineering geologist to sail    Past Surgical History  Procedure Laterality Date  . Lung surgery      due to pneumothorax in his 56s (with postoperative changes on L lung noted prev)  . Hemorrhoid surgery    . Video  bronchoscopy  03/19/2012    Procedure: VIDEO BRONCHOSCOPY WITH FLUORO;  Surgeon: Elsie Stain, MD;  Location: [facility];  Service: Cardiopulmonary;  Laterality: N/A;    Family History  Problem Relation Age of Onset  . Asthma Mother   . Prostate cancer Father   . Colon cancer Neg Hx   . Allergies Mother     Allergies  Allergen Reactions  . Milk-Related Compounds Diarrhea  . Novocain [Procaine Hcl]     Intolerant in distant past  . Spiriva [Tiotropium Bromide Monohydrate]     Urinary retention    Current Outpatient Prescriptions on File Prior to Visit  Medication Sig Dispense Refill  . albuterol (PROVENTIL HFA;VENTOLIN HFA) 108 (90 BASE) MCG/ACT inhaler Inhale 2 puffs into the lungs every 6 (six) hours as needed. 18 g 5  . ALPRAZolam (XANAX) 1 MG tablet Take 0.5-1 tablets (0.5-1 mg total) by mouth at bedtime as needed for anxiety. Take as needed 10 tablet 0  . beclomethasone (BECONASE AQ) 42 MCG/SPRAY nasal spray use 2 sprays in each nostril twice daily 50 g 5   No current facility-administered medications on file prior to visit.    BP 118/80 mmHg  Pulse 81  Temp(Src) 98.6 F (37 C) (Oral)  Ht 5' 10.5" (1.791 m)  Wt 221 lb 12.8  oz (100.608 kg)  BMI 31.36 kg/m2  SpO2 95%    Objective:   Physical Exam  Constitutional: He appears well-nourished. He appears ill.  HENT:  Right Ear: Tympanic membrane and ear canal normal.  Left Ear: Tympanic membrane and ear canal normal.  Nose: Nose normal.  Mouth/Throat: Oropharynx is clear and moist.  Eyes: Conjunctivae are normal. Pupils are equal, round, and reactive to light.  Neck: Neck supple.  Cardiovascular: Normal rate and regular rhythm.   Pulmonary/Chest: He has decreased breath sounds in the left lower field. He has rhonchi in the left upper field.  Lymphadenopathy:    He has no cervical adenopathy.  Skin: Skin is warm and dry.          Assessment & Plan:  Cough:  Do not suspect bacterial involvement at  this point but patient is insistent on antibiotics as he believes he's infected. Symptoms have been present for 24 hours. Xray today to rule out pneumonia. He reports a history of "spots" to his lungs that have shown up on xray and is not interested in pursuing further evaluation as he's had multiple biopsies.  Lungs seem tight with little airflow movement. Treat with short burst of prednisone. RX for Cheratussin AC for cough with drowsiness precautions. Treatment pending xray results.

## 2015-03-21 NOTE — Patient Instructions (Signed)
Complete xray(s) prior to leaving today. I will contact you regarding your results. Start Prednisone. Take 2 tablets by mouth daily for 6 days. You may take the Cheratussin cough syrup as needed for cough. This medication will make you drowsy.  It was nice meeting you!

## 2015-09-25 ENCOUNTER — Encounter: Payer: Self-pay | Admitting: Family Medicine

## 2015-09-25 ENCOUNTER — Ambulatory Visit (INDEPENDENT_AMBULATORY_CARE_PROVIDER_SITE_OTHER): Payer: BLUE CROSS/BLUE SHIELD | Admitting: Family Medicine

## 2015-09-25 VITALS — BP 110/70 | HR 70 | Temp 97.4°F | Ht 71.0 in | Wt 226.2 lb

## 2015-09-25 DIAGNOSIS — Z23 Encounter for immunization: Secondary | ICD-10-CM

## 2015-09-25 DIAGNOSIS — Z125 Encounter for screening for malignant neoplasm of prostate: Secondary | ICD-10-CM

## 2015-09-25 DIAGNOSIS — Z119 Encounter for screening for infectious and parasitic diseases, unspecified: Secondary | ICD-10-CM

## 2015-09-25 DIAGNOSIS — R911 Solitary pulmonary nodule: Secondary | ICD-10-CM

## 2015-09-25 DIAGNOSIS — Z131 Encounter for screening for diabetes mellitus: Secondary | ICD-10-CM

## 2015-09-25 DIAGNOSIS — R195 Other fecal abnormalities: Secondary | ICD-10-CM

## 2015-09-25 DIAGNOSIS — F418 Other specified anxiety disorders: Secondary | ICD-10-CM

## 2015-09-25 DIAGNOSIS — R51 Headache: Secondary | ICD-10-CM

## 2015-09-25 DIAGNOSIS — J449 Chronic obstructive pulmonary disease, unspecified: Secondary | ICD-10-CM

## 2015-09-25 DIAGNOSIS — Z7189 Other specified counseling: Secondary | ICD-10-CM

## 2015-09-25 DIAGNOSIS — R519 Headache, unspecified: Secondary | ICD-10-CM

## 2015-09-25 DIAGNOSIS — Z0000 Encounter for general adult medical examination without abnormal findings: Secondary | ICD-10-CM | POA: Diagnosis not present

## 2015-09-25 LAB — GLUCOSE, RANDOM: GLUCOSE: 101 mg/dL — AB (ref 70–99)

## 2015-09-25 LAB — PSA: PSA: 2.62 ng/mL (ref 0.10–4.00)

## 2015-09-25 MED ORDER — ALPRAZOLAM 1 MG PO TABS: 0.5000 mg | ORAL_TABLET | Freq: Every evening | ORAL | Status: DC | PRN

## 2015-09-25 MED ORDER — ZOSTER VACCINE LIVE 19400 UNT/0.65ML ~~LOC~~ SOLR: 0.6500 mL | Freq: Once | SUBCUTANEOUS | Status: DC

## 2015-09-25 MED ORDER — ALBUTEROL SULFATE HFA 108 (90 BASE) MCG/ACT IN AERS: 2.0000 | INHALATION_SPRAY | Freq: Four times a day (QID) | RESPIRATORY_TRACT | Status: DC | PRN

## 2015-09-25 MED ORDER — BECLOMETHASONE DIPROP MONOHYD 42 MCG/SPRAY NA SUSP: NASAL | Status: DC

## 2015-09-25 NOTE — Patient Instructions (Addendum)
Check with your insurance to see if they will cover the shingles shot. Go to the lab on the way out.  We'll contact you with your lab report. Take care.

## 2015-09-25 NOTE — Progress Notes (Signed)
Pre visit review using our clinic review tool, if applicable. No additional management support is needed unless otherwise documented below in the visit note.  CPE- See plan.  Routine anticipatory guidance given to patient.  See health maintenance. Tdap 2014 Shingles d/w pt.  Rx given to patient.   Flu 2016 PNA 2016 Living will d/w pt.  Wife designated if patient were incapacitated.  Diet and exercise d/w pt. Exercise on the job, active.   PSA screening d/w pt.  FH noted.  No LUTS, he is improved from prev.   HCV prev done per patient report, ~2006 or before with prev MD.  Pt opts in for HIV screening.  D/w pt re: routine screening.  Colonoscopy 2014 Sugar pending, see notes on labs.    COPD.  Rare SABA use.  Complaint with other inhaler.  Not SOB.   Anxiety. Rare BZD use.   Diarrhea resolved with diet changes, cut out milk.  Doing well.   Pulmonary nodule.  Declined f/u.   D/w pt about risk of possible CA, he clearly and absolutely declined any f/u about this, regardless of any possible risk to himself.    He is waking with HA attributed to neck pain that is longstanding.  He doesn't wake from snoring, no known OSA.   PMH and SH reviewed  Meds, vitals, and allergies reviewed.   ROS: See HPI.  Otherwise negative.    GEN: nad, alert and oriented HEENT: mucous membranes moist NECK: supple w/o LA, R occiput ttp.  CV: rrr. PULM: ctab, no inc wob ABD: soft, +bs, umbilical hernia noted, with diastasis noted.   EXT: no edema SKIN: no acute rash, AKs on head, declined treatment with liq N2- d/w pt about precancerous status.

## 2015-09-26 LAB — HIV ANTIBODY (ROUTINE TESTING W REFLEX): HIV 1&2 Ab, 4th Generation: NONREACTIVE

## 2015-09-27 DIAGNOSIS — R519 Headache, unspecified: Secondary | ICD-10-CM | POA: Insufficient documentation

## 2015-09-27 DIAGNOSIS — R51 Headache: Secondary | ICD-10-CM

## 2015-09-27 DIAGNOSIS — Z7189 Other specified counseling: Secondary | ICD-10-CM | POA: Insufficient documentation

## 2015-09-27 NOTE — Assessment & Plan Note (Signed)
Continue current meds 

## 2015-09-27 NOTE — Assessment & Plan Note (Signed)
Continue prn BZD.

## 2015-09-27 NOTE — Assessment & Plan Note (Signed)
Declined f/u. D/w pt about risk of possible CA, he clearly and absolutely declined any f/u about this, regardless of any possible risk to himself.

## 2015-09-27 NOTE — Assessment & Plan Note (Signed)
Tdap 2014 Shingles d/w pt.  Rx given to patient.   Flu 2016 PNA 2016 Living will d/w pt.  Wife designated if patient were incapacitated.  Diet and exercise d/w pt. Exercise on the job, active.   PSA screening d/w pt.  FH noted.  No LUTS, he is improved from prev.   HCV prev done per patient report, ~2006 or before with prev MD.  Pt opts in for HIV screening.  D/w pt re: routine screening.  Colonoscopy 2014 Sugar pending, see notes on labs.

## 2015-09-27 NOTE — Assessment & Plan Note (Signed)
Likely MSK source, d/w pt about stretching/ice/heat.  F/u prn.  No midline pain.  No alarming sx.  D/w pt about pillow use/choice.

## 2015-09-28 ENCOUNTER — Encounter: Payer: Self-pay | Admitting: *Deleted

## 2015-10-03 ENCOUNTER — Ambulatory Visit: Payer: BLUE CROSS/BLUE SHIELD

## 2015-11-01 ENCOUNTER — Ambulatory Visit: Payer: BLUE CROSS/BLUE SHIELD

## 2015-11-02 ENCOUNTER — Ambulatory Visit (INDEPENDENT_AMBULATORY_CARE_PROVIDER_SITE_OTHER): Payer: BLUE CROSS/BLUE SHIELD | Admitting: *Deleted

## 2015-11-02 DIAGNOSIS — Z23 Encounter for immunization: Secondary | ICD-10-CM

## 2015-11-12 ENCOUNTER — Telehealth: Payer: Self-pay | Admitting: Family Medicine

## 2015-11-12 NOTE — Telephone Encounter (Signed)
Patient Name: Randall Davis  DOB: [birth year]    Initial Comment Caller states he had blood in his stool. Lower abdominal pain.   Nurse Assessment  Nurse: Verlin Fester RN, Stanton Kidney Date/Time Eilene Ghazi Time): 11/12/2015 10:18:14 AM  Confirm and document reason for call. If symptomatic, describe symptoms. You must click the next button to save text entered. ---Patient states he was having trouble passing his stool and he had blood in the stool with abdominal pain. He is still real sore in the lower intestines on the left.  Has the patient traveled out of the country within the last 30 days? ---No  Does the patient have any new or worsening symptoms? ---Yes  Will a triage be completed? ---Yes  Related visit to physician within the last 2 weeks? ---No  Does the PT have any chronic conditions? (i.e. diabetes, asthma, etc.) ---Yes  List chronic conditions. ---"hx blockage in the bowels"  Is this a behavioral health or substance abuse call? ---No     Guidelines    Guideline Title Affirmed Question Affirmed Notes  Abdominal Pain - Male Age > 60 years    Final Disposition User   See Physician within 24 Hours Noe, RN, Stanton Kidney    Comments  No appointments for Dr. Phillip Heal. Patient states he can not come in today states it will have to been after 4pm tomorrow. Appointment made with Webb Silversmith for tomorrow at [facility]   Disagree/Comply: Comply

## 2015-11-12 NOTE — Telephone Encounter (Signed)
Pt has appt 11/13/15 at 4 pm with Avie Echevaria NP.

## 2015-11-13 ENCOUNTER — Encounter: Payer: Self-pay | Admitting: Internal Medicine

## 2015-11-13 ENCOUNTER — Ambulatory Visit (INDEPENDENT_AMBULATORY_CARE_PROVIDER_SITE_OTHER): Payer: BLUE CROSS/BLUE SHIELD | Admitting: Internal Medicine

## 2015-11-13 VITALS — BP 118/84 | HR 73 | Temp 97.9°F | Wt 221.0 lb

## 2015-11-13 DIAGNOSIS — K648 Other hemorrhoids: Secondary | ICD-10-CM | POA: Diagnosis not present

## 2015-11-13 DIAGNOSIS — K5730 Diverticulosis of large intestine without perforation or abscess without bleeding: Secondary | ICD-10-CM

## 2015-11-13 DIAGNOSIS — K644 Residual hemorrhoidal skin tags: Secondary | ICD-10-CM

## 2015-11-13 NOTE — Progress Notes (Signed)
Pre visit review using our clinic review tool, if applicable. No additional management support is needed unless otherwise documented below in the visit note. 

## 2015-11-13 NOTE — Patient Instructions (Signed)
Diverticulosis Diverticulosis is the condition that develops when small pouches (diverticula) form in the wall of your colon. Your colon, or large intestine, is where water is absorbed and stool is formed. The pouches form when the inside layer of your colon pushes through weak spots in the outer layers of your colon. CAUSES  No one knows exactly what causes diverticulosis. RISK FACTORS  Being older than 50. Your risk for this condition increases with age. Diverticulosis is rare in people younger than 40 years. By age 80, almost everyone has it.  Eating a low-fiber diet.  Being frequently constipated.  Being overweight.  Not getting enough exercise.  Smoking.  Taking over-the-counter pain medicines, like aspirin and ibuprofen. SYMPTOMS  Most people with diverticulosis do not have symptoms. DIAGNOSIS  Because diverticulosis often has no symptoms, health care providers often discover the condition during an exam for other colon problems. In many cases, a health care provider will diagnose diverticulosis while using a flexible scope to examine the colon (colonoscopy). TREATMENT  If you have never developed an infection related to diverticulosis, you may not need treatment. If you have had an infection before, treatment may include:  Eating more fruits, vegetables, and grains.  Taking a fiber supplement.  Taking a live bacteria supplement (probiotic).  Taking medicine to relax your colon. HOME CARE INSTRUCTIONS   Drink at least 6-8 glasses of water each day to prevent constipation.  Try not to strain when you have a bowel movement.  Keep all follow-up appointments. If you have had an infection before:  Increase the fiber in your diet as directed by your health care provider or dietitian.  Take a dietary fiber supplement if your health care provider approves.  Only take medicines as directed by your health care provider. SEEK MEDICAL CARE IF:   You have abdominal  pain.  You have bloating.  You have cramps.  You have not gone to the bathroom in 3 days. SEEK IMMEDIATE MEDICAL CARE IF:   Your pain gets worse.  Yourbloating becomes very bad.  You have a fever or chills, and your symptoms suddenly get worse.  You begin vomiting.  You have bowel movements that are bloody or black. MAKE SURE YOU:  Understand these instructions.  Will watch your condition.  Will get help right away if you are not doing well or get worse.   This information is not intended to replace advice given to you by your health care provider. Make sure you discuss any questions you have with your health care provider.   Document Released: 07/03/2004 Document Revised: 10/11/2013 Document Reviewed: 08/31/2013 Elsevier Interactive Patient Education 2016 Elsevier Inc.  

## 2015-11-14 ENCOUNTER — Encounter: Payer: Self-pay | Admitting: Internal Medicine

## 2015-11-14 NOTE — Progress Notes (Signed)
Subjective:    Patient ID: Randall Davis, male    DOB: [birth year], 65 y.o.   MRN: [id number]  HPI  Pt presents to the clinic today with c/o an episode of LLQ abdominal pain. This occurred last night. He reports the pain as severe. It feels as if "food is violently moving through his stomach". The episode was accompanied by nausea, bloating, loose stool and blood in his stool. His symptoms were relieved after all the stool moved out of his colon. He now only c/o soreness in the lower abdomen. He has had 4 episodes like this over the past 2 years. He has never been evaluated for it. He has not made any recent changes in his diet. He is lactose intolerant. He does have a history of hemorrhoids. He has not tried anything OTC. He has on history of diverticulosis or diverticulitis but colonoscopy report from 2014 noted diverticulosis throughout the descending and sigmoid colon.  Review of Systems     Past Medical History  Diagnosis Date  . Former smoker   . COPD (chronic obstructive pulmonary disease) ([place])   . Allergy   . Anxiety disorder     Current Outpatient Prescriptions  Medication Sig Dispense Refill  . albuterol (PROVENTIL HFA;VENTOLIN HFA) 108 (90 BASE) MCG/ACT inhaler Inhale 2 puffs into the lungs every 6 (six) hours as needed. 18 g 5  . ALPRAZolam (XANAX) 1 MG tablet Take 0.5-1 tablets (0.5-1 mg total) by mouth at bedtime as needed for anxiety. Take as needed 10 tablet 0  . aspirin-acetaminophen-caffeine (EXCEDRIN MIGRAINE) 250-250-65 MG tablet Take 2 tablets by mouth daily.    . beclomethasone (BECONASE AQ) 42 MCG/SPRAY nasal spray use 2 sprays in each nostril twice daily 50 g 12  . Multiple Vitamin (MULTIVITAMIN) tablet Take 1 tablet by mouth daily.    . naproxen sodium (ANAPROX) 220 MG tablet Take 220 mg by mouth daily as needed.    . NONFORMULARY OR COMPOUNDED ITEM neutrogenics "testosterone booster."    . vitamin E 100 UNIT capsule Take 200 Units by mouth daily.    Marland Kitchen zinc  gluconate 50 MG tablet Take 50 mg by mouth daily.    Marland Kitchen zoster vaccine live, PF, (ZOSTAVAX) 65784 UNT/0.65ML injection Inject 19,400 Units into the skin once. 1 each 0   No current facility-administered medications for this visit.    Allergies  Allergen Reactions  . Milk-Related Compounds Diarrhea  . Novocain [Procaine Hcl]     Intolerant in distant past  . Spiriva [Tiotropium Bromide Monohydrate]     Urinary retention    Family History  Problem Relation Age of Onset  . Asthma Mother   . Allergies Mother   . Prostate cancer Father   . Colon cancer Neg Hx     Social History   Social History  . Marital Status: Married    Spouse Name: N/A  . Number of Children: 2  . Years of Education: N/A   Occupational History  . Truck Geophysicist/field seismologist    Social History Main Topics  . Smoking status: Former Smoker -- 1.50 packs/day for 40 years    Types: Cigarettes    Quit date: 10/20/2001  . Smokeless tobacco: Never Used  . Alcohol Use: Yes     Comment: occ drink  . Drug Use: No  . Sexual Activity: Not on file   Other Topics Concern  . Not on file   Social History Narrative   Married 2000   2 kids   Trucker,  former cabinet work   Engineering geologist to sail     Constitutional: Denies fever, malaise, fatigue, headache or abrupt weight changes.  Respiratory: Denies difficulty breathing, shortness of breath, cough or sputum production.   Cardiovascular: Denies chest pain, chest tightness, palpitations or swelling in the hands or feet.  Gastrointestinal: Pt reports abdominal pain, diarrhea and blood in his stool. Denies constipation.  GU: Denies urgency, frequency, pain with urination, burning sensation, blood in urine, odor or discharge.   No other specific complaints in a complete review of systems (except as listed in HPI above).  Objective:   Physical Exam   BP 118/84 mmHg  Pulse 73  Temp(Src) 97.9 F (36.6 C) (Oral)  Wt 221 lb (100.245 kg)  SpO2 97% Wt Readings from Last 3  Encounters:  11/13/15 221 lb (100.245 kg)  09/25/15 226 lb 4 oz (102.626 kg)  03/21/15 221 lb 12.8 oz (100.608 kg)    General: Appears his stated age, obese in NAD. Neck:  Neck supple, trachea midline. No masses, lumps or thyromegaly present.  Cardiovascular: Normal rate and rhythm. S1,S2 noted.  Pulmonary/Chest: Normal effort and positive vesicular breath sounds. No respiratory distress. No wheezes, rales or ronchi noted.  Abdomen: Soft and mildly tender in the LLQ. Normal bowel sounds. Large ventral hernia noted.   BMET    Component Value Date/Time   NA 137 06/30/2014 1540   K 4.2 06/30/2014 1540   CL 101 06/30/2014 1540   CO2 29 06/30/2014 1540   GLUCOSE 101* 09/25/2015 1047   BUN 23 06/30/2014 1540   CREATININE 1.04 06/30/2014 1540   CREATININE 0.94 05/24/2013 0143   CALCIUM 9.4 06/30/2014 1540   GFRNONAA 87* 05/24/2013 0143   GFRAA >90 05/24/2013 0143    Lipid Panel     Component Value Date/Time   CHOL 139 02/06/2012 0831   TRIG 100.0 02/06/2012 0831   HDL 27.30* 02/06/2012 0831   CHOLHDL 5 02/06/2012 0831   VLDL 20.0 02/06/2012 0831   LDLCALC 92 02/06/2012 0831    CBC    Component Value Date/Time   WBC 8.4 06/30/2014 1540   RBC 5.39 06/30/2014 1540   HGB 16.5 06/30/2014 1540   HCT 48.3 06/30/2014 1540   PLT 317 06/30/2014 1540   MCV 89.6 06/30/2014 1540   MCH 30.6 06/30/2014 1540   MCHC 34.2 06/30/2014 1540   RDW 13.3 06/30/2014 1540   LYMPHSABS 2.5 06/30/2014 1540   MONOABS 0.9 06/30/2014 1540   EOSABS 0.2 06/30/2014 1540   BASOSABS 0.1 06/30/2014 1540    Hgb A1C Lab Results  Component Value Date   HGBA1C 5.5 05/24/2013        Assessment & Plan:   Diverticulosis flare:  No evidence of diverticulitis at this time Will hold off on abx He is treating hemorrhoids with OTC cream Discussed diet modification for diverticulosis- he does eat a lot of foods containing small seeds If persist or worsens, consider CT abdomen  RTC as needed or if  symptoms persist or worsen

## 2016-04-18 ENCOUNTER — Encounter: Payer: Self-pay | Admitting: Gastroenterology

## 2016-11-24 ENCOUNTER — Ambulatory Visit (INDEPENDENT_AMBULATORY_CARE_PROVIDER_SITE_OTHER): Payer: BLUE CROSS/BLUE SHIELD | Admitting: Internal Medicine

## 2016-11-24 ENCOUNTER — Encounter: Payer: Self-pay | Admitting: Internal Medicine

## 2016-11-24 VITALS — BP 120/78 | HR 81 | Temp 98.7°F | Wt 223.0 lb

## 2016-11-24 DIAGNOSIS — J441 Chronic obstructive pulmonary disease with (acute) exacerbation: Secondary | ICD-10-CM

## 2016-11-24 MED ORDER — METHYLPREDNISOLONE ACETATE 80 MG/ML IJ SUSP
80.0000 mg | Freq: Once | INTRAMUSCULAR | Status: AC
  Administered 2016-11-24: 80 mg via INTRAMUSCULAR

## 2016-11-24 MED ORDER — HYDROCODONE-HOMATROPINE 5-1.5 MG/5ML PO SYRP: 5.0000 mL | ORAL_SOLUTION | Freq: Three times a day (TID) | ORAL | 0 refills | Status: DC | PRN

## 2016-11-24 MED ORDER — PREDNISONE 10 MG PO TABS: ORAL_TABLET | ORAL | 0 refills | Status: DC

## 2016-11-24 NOTE — Progress Notes (Signed)
HPI  Pt presents to the clinic today with c/o runny nose, scratchy throat and cough. This started 2-3 days ago. He is blowing clear mucous out of his nose. He denies difficulty swallowing. The cough is nonproductive. He has had some chest tightness but denies shortness of breath. He also reports fever and body aches, but denies chills. He has tried Excedrin, Dayquil and Nyquil with some relief. He has a history of allergies and COPD. He has had sick contacts. He did not get a flu shot this year.  Review of Systems        Past Medical History:  Diagnosis Date  . Allergy   . Anxiety disorder   . COPD (chronic obstructive pulmonary disease) ([place])   . Former smoker     Family History  Problem Relation Age of Onset  . Asthma Mother   . Allergies Mother   . Prostate cancer Father   . Colon cancer Neg Hx     Social History   Social History  . Marital status: Married    Spouse name: N/A  . Number of children: 2  . Years of education: N/A   Occupational History  . Truck Chemical engineer   Social History Main Topics  . Smoking status: Former Smoker    Packs/day: 1.50    Years: 40.00    Types: Cigarettes    Quit date: 10/20/2001  . Smokeless tobacco: Never Used  . Alcohol use Yes     Comment: occ drink  . Drug use: No  . Sexual activity: Not on file   Other Topics Concern  . Not on file   Social History Narrative   Married 2000   2 kids   Trucker, former Programme researcher, broadcasting/film/video work   Likes to sail    Allergies  Allergen Reactions  . Milk-Related Compounds Diarrhea  . Novocain [Procaine Hcl]     Intolerant in distant past  . Spiriva [Tiotropium Bromide Monohydrate]     Urinary retention     Constitutional:  Denies headache, fatigue, fever or abrupt weight changes.  HEENT:  Positive runny nose, sore throat. Denies eye redness, eye pain, pressure behind the eyes, facial pain, nasal congestion, ear pain, ringing in the ears, wax buildup, or bloody nose. Respiratory:  Positive cough. Denies difficulty breathing or shortness of breath.  Cardiovascular: Positive chest tightness. Denies chest pain,  palpitations or swelling in the hands or feet.   No other specific complaints in a complete review of systems (except as listed in HPI above).  Objective:   BP 120/78   Pulse 81   Temp 98.7 F (37.1 C) (Oral)   Wt 223 lb (101.2 kg)   SpO2 97%   BMI 31.10 kg/m   Wt Readings from Last 3 Encounters:  11/24/16 223 lb (101.2 kg)  11/13/15 221 lb (100.2 kg)  09/25/15 226 lb 4 oz (102.6 kg)     General: Appears his stated age, in NAD. HEENT: Head: normal shape and size, no sinus tenderness noted; Ears: Tm's gray and intact, normal light reflex; Nose: mucosa boggy and moist, septum midline; Throat/Mouth: + PND. Teeth present, mucosa erythematous and moist, no exudate noted, no lesions or ulcerations noted.  Neck: No cervical lymphadenopathy.  Cardiovascular: Normal rate and rhythm.  Pulmonary/Chest: Normal effort and positive vesicular breath sounds with bilateral expiratory wheeze noted. No respiratory distress. No  rales or ronchi noted.       Assessment & Plan:   COPD exacerbation:  Get some rest  and drink plenty of water Do salt water gargles for the sore throat 80 mg Depo IM today eRx for Pred Taper x 5 days Rx for Hycodan cough syrup  RTC as needed or if symptoms persist.   Webb Silversmith, NP

## 2016-11-24 NOTE — Addendum Note (Signed)
Addended by: Lurlean Nanny on: 11/24/2016 04:34 PM   Modules accepted: Orders

## 2016-11-24 NOTE — Patient Instructions (Signed)
Cough, Adult Introduction A cough helps to clear your throat and lungs. A cough may last only 2-3 weeks (acute), or it may last longer than 8 weeks (chronic). Many different things can cause a cough. A cough may be a sign of an illness or another medical condition. Follow these instructions at home:  Pay attention to any changes in your cough.  Take medicines only as told by your doctor.  If you were prescribed an antibiotic medicine, take it as told by your doctor. Do not stop taking it even if you start to feel better.  Talk with your doctor before you try using a cough medicine.  Drink enough fluid to keep your pee (urine) clear or pale yellow.  If the air is dry, use a cold steam vaporizer or humidifier in your home.  Stay away from things that make you cough at work or at home.  If your cough is worse at night, try using extra pillows to raise your head up higher while you sleep.  Do not smoke, and try not to be around smoke. If you need help quitting, ask your doctor.  Do not have caffeine.  Do not drink alcohol.  Rest as needed. Contact a doctor if:  You have new problems (symptoms).  You cough up yellow fluid (pus).  Your cough does not get better after 2-3 weeks, or your cough gets worse.  Medicine does not help your cough and you are not sleeping well.  You have pain that gets worse or pain that is not helped with medicine.  You have a fever.  You are losing weight and you do not know why.  You have night sweats. Get help right away if:  You cough up blood.  You have trouble breathing.  Your heartbeat is very fast. This information is not intended to replace advice given to you by your health care provider. Make sure you discuss any questions you have with your health care provider. Document Released: 06/19/2011 Document Revised: 03/13/2016 Document Reviewed: 12/13/2014  2017 Elsevier  

## 2016-12-01 ENCOUNTER — Telehealth: Payer: Self-pay

## 2016-12-01 MED ORDER — ALBUTEROL SULFATE HFA 108 (90 BASE) MCG/ACT IN AERS: 2.0000 | INHALATION_SPRAY | Freq: Four times a day (QID) | RESPIRATORY_TRACT | 5 refills | Status: DC | PRN

## 2016-12-01 NOTE — Addendum Note (Signed)
Addended by: Pilar Grammes on: 12/01/2016 04:33 PM   Modules accepted: Orders

## 2016-12-01 NOTE — Telephone Encounter (Signed)
Pt left a message on triage line saying he is still having some tightness in his chest from his OV last week. Still feeling fatigued. Wondered if there was anything else he should do.

## 2016-12-01 NOTE — Telephone Encounter (Signed)
He should make OV to follow up with PCP if still having these symptoms

## 2016-12-01 NOTE — Telephone Encounter (Signed)
Spoke to pt. He said he just does not have energy back up and was wondering if he should take a vitamin or something to help his energy.  He said he would really like to change his PCP to [facility]. He felt very comfortable with her.  He said he needs refills of albuterol inhaler sent to [facility] [address]. I have sent those in.

## 2016-12-02 ENCOUNTER — Encounter: Payer: Self-pay | Admitting: Family Medicine

## 2016-12-02 ENCOUNTER — Ambulatory Visit (INDEPENDENT_AMBULATORY_CARE_PROVIDER_SITE_OTHER): Payer: BLUE CROSS/BLUE SHIELD | Admitting: Family Medicine

## 2016-12-02 VITALS — BP 122/82 | HR 91 | Temp 98.2°F | Ht 71.0 in | Wt 216.0 lb

## 2016-12-02 DIAGNOSIS — J441 Chronic obstructive pulmonary disease with (acute) exacerbation: Secondary | ICD-10-CM

## 2016-12-02 MED ORDER — BENZONATATE 200 MG PO CAPS: 200.0000 mg | ORAL_CAPSULE | Freq: Three times a day (TID) | ORAL | 0 refills | Status: DC | PRN

## 2016-12-02 MED ORDER — AMOXICILLIN-POT CLAVULANATE 875-125 MG PO TABS: 1.0000 | ORAL_TABLET | Freq: Two times a day (BID) | ORAL | 0 refills | Status: DC

## 2016-12-02 NOTE — Progress Notes (Signed)
Pre visit review using our clinic review tool, if applicable. No additional management support is needed unless otherwise documented below in the visit note. 

## 2016-12-02 NOTE — Patient Instructions (Signed)
Follow up for any fever or increased shortness of breath. 

## 2016-12-02 NOTE — Telephone Encounter (Signed)
He really needs to follow up with his PCP, there may be additional causes of his fatigue. I think he should keep his care with Dr. Damita Dunnings at this time.

## 2016-12-02 NOTE — Progress Notes (Signed)
Subjective:     Patient ID: Randall Davis, male   DOB: [birth year], 66 y.o.   MRN: [id number]  HPI Patient seen with couple week history of cough. He has history of COPD. Last Tuesday he was seen and given IM steroids along with oral steroid taper. His cough is reproductive. He's had some brown sputum intermittently. Using albuterol as needed. He feels his wheezing is somewhat improved but still has productive cough and some increased shortness of breath with activity. No vomiting. No hemoptysis. No pleuritic pain.  Past Medical History:  Diagnosis Date  . Allergy   . Anxiety disorder   . COPD (chronic obstructive pulmonary disease) ([place])   . Former smoker    Past Surgical History:  Procedure Laterality Date  . HEMORRHOID SURGERY    . LUNG SURGERY     due to pneumothorax in his 61s (with postoperative changes on L lung noted prev)  . VIDEO BRONCHOSCOPY  03/19/2012   Procedure: VIDEO BRONCHOSCOPY WITH FLUORO;  Surgeon: Elsie Stain, MD;  Location: [facility];  Service: Cardiopulmonary;  Laterality: N/A;    reports that he quit smoking about 15 years ago. His smoking use included Cigarettes. He has a 60.00 pack-year smoking history. He has never used smokeless tobacco. He reports that he drinks alcohol. He reports that he does not use drugs. family history includes Allergies in his mother; Asthma in his mother; Prostate cancer in his father. Allergies  Allergen Reactions  . Milk-Related Compounds Diarrhea  . Novocain [Procaine Hcl]     Intolerant in distant past  . Spiriva [Tiotropium Bromide Monohydrate]     Urinary retention     Review of Systems  Constitutional: Negative for chills, fever and unexpected weight change.  HENT: Negative for congestion.   Respiratory: Positive for cough.        Objective:   Physical Exam  Constitutional: He appears well-developed and well-nourished.  Neck: Neck supple.  Cardiovascular: Normal rate and regular rhythm.   Pulmonary/Chest:  Effort normal.  Slightly diminished breath sounds throughout. No active wheezes. No rales.  Musculoskeletal: He exhibits no edema.       Assessment:     Acute exacerbation of COPD. No respiratory distress. Pulse oximetry 98% at rest    Plan:     -Given duration of productive cough along with some subjective shortness of breath start Augmentin 875 mg twice daily -Follow-up immediately for any increased shortness of breath or fever  Eulas Post MD [facility]

## 2016-12-03 NOTE — Telephone Encounter (Addendum)
Needs OV for continued sx, 64min if at all possible.   If he has a concern about the care I've provided, then please have him explain at the [place].  Thanks.

## 2016-12-03 NOTE — Telephone Encounter (Signed)
Left message on voicemail for patient to call back. 

## 2016-12-03 NOTE — Telephone Encounter (Signed)
Patient notified as instructed by telephone and verbalized understanding. Patient was very upset that he could not switch over to Webb Silversmith, NP for his care. Patient stated that when he calls the office to be seen he is never able to see Dr. Damita Dunnings and even has had to go to [place] several times. Patient stated that his work schedule is very demanding and it is hard for him to get off from work. Offered patient an appointment on a Wednesday evening to try and accommodate his work schedule since he stated that he gets off at 3:00. Patient stated that he was hoping that Webb Silversmith NP would take him on as a patient because he does not get along with Dr. Damita Dunnings and they do not see eye to eye on things. Patient was very upset and used some bad language and I informed him that I would try to help him, but was not going to listen to the language that he was using. Patient apologized several times for this. Patient stated that he does want to stay with the practice, but does not want to see Dr. Damita Dunnings and does not want to come in for well checks annually.  Patient requested that the manager call him and discuss this with him.

## 2016-12-12 ENCOUNTER — Telehealth: Payer: Self-pay | Admitting: Family Medicine

## 2016-12-12 NOTE — Telephone Encounter (Signed)
Is this ok?

## 2016-12-12 NOTE — Telephone Encounter (Signed)
Pt want to transfer to cory Nafziger

## 2016-12-14 NOTE — Telephone Encounter (Signed)
Okay with me.  Thanks.  Routed to [employer] for input.

## 2016-12-15 NOTE — Telephone Encounter (Signed)
See below, please make the change.  Thanks.

## 2016-12-15 NOTE — Telephone Encounter (Signed)
Ok with me 

## 2016-12-16 ENCOUNTER — Encounter: Payer: Self-pay | Admitting: Adult Health

## 2016-12-16 ENCOUNTER — Other Ambulatory Visit: Payer: Self-pay

## 2016-12-16 ENCOUNTER — Ambulatory Visit (INDEPENDENT_AMBULATORY_CARE_PROVIDER_SITE_OTHER): Payer: BLUE CROSS/BLUE SHIELD | Admitting: Adult Health

## 2016-12-16 VITALS — BP 138/80 | Temp 97.9°F | Ht 71.0 in | Wt 216.8 lb

## 2016-12-16 DIAGNOSIS — Z7689 Persons encountering health services in other specified circumstances: Secondary | ICD-10-CM

## 2016-12-16 DIAGNOSIS — N529 Male erectile dysfunction, unspecified: Secondary | ICD-10-CM

## 2016-12-16 DIAGNOSIS — F418 Other specified anxiety disorders: Secondary | ICD-10-CM | POA: Diagnosis not present

## 2016-12-16 MED ORDER — TADALAFIL 20 MG PO TABS: 10.0000 mg | ORAL_TABLET | ORAL | 11 refills | Status: DC | PRN

## 2016-12-16 MED ORDER — BECLOMETHASONE DIPROP MONOHYD 42 MCG/SPRAY NA SUSP: NASAL | 12 refills | Status: DC

## 2016-12-16 MED ORDER — ALPRAZOLAM 1 MG PO TABS: 0.5000 mg | ORAL_TABLET | Freq: Every evening | ORAL | 0 refills | Status: DC | PRN

## 2016-12-16 NOTE — Telephone Encounter (Signed)
Please make establish care appt. Thanks!

## 2016-12-16 NOTE — Progress Notes (Signed)
Patient presents to clinic today to establish care. He is a pleasant 66 year old male who  has a past medical history of Allergy; Anxiety disorder; COPD (chronic obstructive pulmonary disease) ([place]); Diverticulitis; Dry eyes; ED (erectile dysfunction); Former smoker; Lactose intolerance; Pulmonary nodule; and Small bowel obstruction.  He is a prior patient of Dr. Elsie Stain   His last physical was in 09/2015?  Acute Concerns: Establish Care   Chronic Issues: COPD - Controlled well with   Dry Eyes - has been having to use artifical tears 4-5 times per day and still feels as though he needs to use is more often.   Hx of pulmonary nodule - he declined f/u for this issue. He understands his risks including cancer and ultimately death   ED- Has taken Viagra in the past but it caused him a headache. He would like to try Cialis    Health Maintenance: Dental -- Routine Care  Vision -- Three years ago.  Immunizations -- Needs  Colonoscopy -- 2014- recommend follow up in 3 years   Diet: he has changed his diet and is eating a heart healthy diet. Exercise: Does not exercise on a regular basis    Wt Readings from Last 3 Encounters:  12/16/16 216 lb 12.8 oz (98.3 kg)  12/02/16 216 lb (98 kg)  11/24/16 223 lb (101.2 kg)     Past Medical History:  Diagnosis Date  . Allergy   . Anxiety disorder   . COPD (chronic obstructive pulmonary disease) ([place])   . Diverticulitis   . Dry eyes   . ED (erectile dysfunction)   . Former smoker   . Lactose intolerance   . Pulmonary nodule   . Small bowel obstruction     Past Surgical History:  Procedure Laterality Date  . HEMORRHOID SURGERY    . LUNG SURGERY Left    due to pneumothorax in his 86s (with postoperative changes on L lung noted prev)  . VIDEO BRONCHOSCOPY  03/19/2012   Procedure: VIDEO BRONCHOSCOPY WITH FLUORO;  Surgeon: Elsie Stain, MD;  Location: [facility];  Service: Cardiopulmonary;  Laterality: N/A;    Current  Outpatient Prescriptions on File Prior to Visit  Medication Sig Dispense Refill  . albuterol (PROVENTIL HFA;VENTOLIN HFA) 108 (90 Base) MCG/ACT inhaler Inhale 2 puffs into the lungs every 6 (six) hours as needed. 18 g 5  . ALPRAZolam (XANAX) 1 MG tablet Take 0.5-1 tablets (0.5-1 mg total) by mouth at bedtime as needed for anxiety. Take as needed 10 tablet 0  . aspirin-acetaminophen-caffeine (EXCEDRIN MIGRAINE) 250-250-65 MG tablet Take 2 tablets by mouth daily.    . beclomethasone (BECONASE AQ) 42 MCG/SPRAY nasal spray use 2 sprays in each nostril twice daily 50 g 12  . Multiple Vitamin (MULTIVITAMIN) tablet Take 1 tablet by mouth daily.    . naproxen sodium (ANAPROX) 220 MG tablet Take 220 mg by mouth daily as needed.    . NONFORMULARY OR COMPOUNDED ITEM neutrogenics "testosterone booster."    . vitamin E 100 UNIT capsule Take 200 Units by mouth daily.    Marland Kitchen zinc gluconate 50 MG tablet Take 50 mg by mouth daily.     No current facility-administered medications on file prior to visit.     Allergies  Allergen Reactions  . Milk-Related Compounds Diarrhea  . Novocain [Procaine Hcl]     Intolerant in distant past  . Spiriva [Tiotropium Bromide Monohydrate]     Urinary retention    Family History  Problem Relation Age of Onset  . Asthma Mother   . Allergies Mother   . Prostate cancer Father   . Colon cancer Neg Hx     Social History   Social History  . Marital status: Married    Spouse name: N/A  . Number of children: 2  . Years of education: N/A   Occupational History  . Truck Chemical engineer   Social History Main Topics  . Smoking status: Former Smoker    Packs/day: 1.50    Years: 40.00    Types: Cigarettes    Quit date: 10/20/2001  . Smokeless tobacco: Never Used  . Alcohol use Yes     Comment: occ drink  . Drug use: No  . Sexual activity: Not on file   Other Topics Concern  . Not on file   Social History Narrative   Married 2000   2 Glass blower/designer, former Programme researcher, broadcasting/film/video work   Engineering geologist to sail    Review of Systems  Constitutional: Negative.   Respiratory: Negative.   Cardiovascular: Negative.   Gastrointestinal: Negative.   Genitourinary: Negative.   Musculoskeletal: Negative.   Neurological: Negative.   All other systems reviewed and are negative.   BP 138/80   Temp 97.9 F (36.6 C) (Oral)   Ht 5\' 11"  (1.803 m)   Wt 216 lb 12.8 oz (98.3 kg)   BMI 30.24 kg/m   Physical Exam  Constitutional: He is oriented to person, place, and time and well-developed, well-nourished, and in no distress. No distress.  HENT:  Head: Normocephalic and atraumatic.  Right Ear: External ear normal.  Left Ear: External ear normal.  Nose: Nose normal.  Mouth/Throat: Oropharynx is clear and moist. No oropharyngeal exudate.  Eyes: Conjunctivae and EOM are normal. Pupils are equal, round, and reactive to light. Right eye exhibits no discharge. Left eye exhibits no discharge. No scleral icterus.  Cardiovascular: Normal rate, regular rhythm, normal heart sounds and intact distal pulses.  Exam reveals no gallop and no friction rub.   No murmur heard. Pulmonary/Chest: Effort normal and breath sounds normal. No respiratory distress. He has no wheezes. He has no rales. He exhibits no tenderness.  Musculoskeletal: Normal range of motion. He exhibits no edema, tenderness or deformity.  Neurological: He is alert and oriented to person, place, and time. Gait normal. GCS score is 15.  Skin: Skin is warm and dry. No rash noted. He is not diaphoretic. No erythema. No pallor.  Scattered AK's. He refuses treatment at this time   Psychiatric: Mood, memory, affect and judgment normal.  Nursing note and vitals reviewed.   Assessment/Plan: 1. Encounter to establish care - Follow up for CPE- He is due  - Continue to eat healthy and start exercising on a regular basis -   2. Erectile dysfunction, unspecified erectile dysfunction type  - tadalafil (CIALIS) 20  MG tablet; Take 0.5-1 tablets (10-20 mg total) by mouth every other day as needed for erectile dysfunction.  Dispense: 5 tablet; Refill: 11  3. Situational anxiety  - ALPRAZolam (XANAX) 1 MG tablet; Take 0.5-1 tablets (0.5-1 mg total) by mouth at bedtime as needed for anxiety. Take as needed  Dispense: 10 tablet; Refill: 0   Dorothyann Peng, NP

## 2016-12-19 NOTE — Telephone Encounter (Signed)
Pt has been scheduled.  °

## 2016-12-22 ENCOUNTER — Telehealth: Payer: Self-pay | Admitting: Adult Health

## 2016-12-22 NOTE — Telephone Encounter (Signed)
Patient came in and left a Rx that was printed 12/16/16 with a flier for [facility]. The Rx is for Cialis. The Rx is too expensive and is wondering if you can do a different Rx printed for Sildenafil 20mg  #50. I left the flier and Rx in the envelope from [place] on [facility]'s Desk

## 2016-12-23 ENCOUNTER — Other Ambulatory Visit: Payer: Self-pay

## 2016-12-23 MED ORDER — SILDENAFIL CITRATE 20 MG PO TABS: ORAL_TABLET | ORAL | 0 refills | Status: DC

## 2016-12-23 NOTE — Telephone Encounter (Signed)
Is this ok?

## 2016-12-23 NOTE — Telephone Encounter (Signed)
That is fine.   Take 2-4 pills as needed

## 2016-12-23 NOTE — Telephone Encounter (Signed)
New Rx has been printed and faxed to [facility]. Patient has been notified and verbalized understanding.

## 2017-02-13 ENCOUNTER — Ambulatory Visit (INDEPENDENT_AMBULATORY_CARE_PROVIDER_SITE_OTHER): Payer: BLUE CROSS/BLUE SHIELD | Admitting: Adult Health

## 2017-02-13 ENCOUNTER — Encounter: Payer: Self-pay | Admitting: Adult Health

## 2017-02-13 VITALS — BP 138/70 | Temp 97.9°F | Ht 71.0 in | Wt 221.2 lb

## 2017-02-13 DIAGNOSIS — Z0000 Encounter for general adult medical examination without abnormal findings: Secondary | ICD-10-CM | POA: Diagnosis not present

## 2017-02-13 DIAGNOSIS — Z760 Encounter for issue of repeat prescription: Secondary | ICD-10-CM | POA: Diagnosis not present

## 2017-02-13 DIAGNOSIS — Z1211 Encounter for screening for malignant neoplasm of colon: Secondary | ICD-10-CM

## 2017-02-13 LAB — CBC WITH DIFFERENTIAL/PLATELET
BASOS ABS: 0.1 10*3/uL (ref 0.0–0.1)
Basophils Relative: 1.1 % (ref 0.0–3.0)
EOS ABS: 0.2 10*3/uL (ref 0.0–0.7)
Eosinophils Relative: 2.2 % (ref 0.0–5.0)
HEMATOCRIT: 50 % (ref 39.0–52.0)
HEMOGLOBIN: 16.6 g/dL (ref 13.0–17.0)
LYMPHS PCT: 26.6 % (ref 12.0–46.0)
Lymphs Abs: 2 10*3/uL (ref 0.7–4.0)
MCHC: 33.2 g/dL (ref 30.0–36.0)
MCV: 91.2 fl (ref 78.0–100.0)
Monocytes Absolute: 0.7 10*3/uL (ref 0.1–1.0)
Monocytes Relative: 9.1 % (ref 3.0–12.0)
Neutro Abs: 4.5 10*3/uL (ref 1.4–7.7)
Neutrophils Relative %: 61 % (ref 43.0–77.0)
Platelets: 278 10*3/uL (ref 150.0–400.0)
RBC: 5.49 Mil/uL (ref 4.22–5.81)
RDW: 13.4 % (ref 11.5–15.5)
WBC: 7.4 10*3/uL (ref 4.0–10.5)

## 2017-02-13 LAB — BASIC METABOLIC PANEL
BUN: 22 mg/dL (ref 6–23)
CALCIUM: 9.5 mg/dL (ref 8.4–10.5)
CO2: 30 mEq/L (ref 19–32)
CREATININE: 0.92 mg/dL (ref 0.40–1.50)
Chloride: 102 mEq/L (ref 96–112)
GFR: 87.17 mL/min (ref >60.00)
Glucose, Bld: 94 mg/dL (ref 70–99)
Potassium: 4.5 mEq/L (ref 3.5–5.1)
Sodium: 139 mEq/L (ref 135–145)

## 2017-02-13 LAB — HEPATIC FUNCTION PANEL
ALT: 15 U/L (ref 0–53)
AST: 13 U/L (ref 0–37)
Albumin: 4.1 g/dL (ref 3.5–5.2)
Alkaline Phosphatase: 73 U/L (ref 39–117)
BILIRUBIN DIRECT: 0.1 mg/dL (ref 0.0–0.3)
BILIRUBIN TOTAL: 0.6 mg/dL (ref 0.2–1.2)
TOTAL PROTEIN: 6.4 g/dL (ref 6.0–8.3)

## 2017-02-13 LAB — PSA: PSA: 3.01 ng/mL (ref 0.10–4.00)

## 2017-02-13 LAB — TSH: TSH: 1.73 u[IU]/mL (ref 0.35–4.50)

## 2017-02-13 LAB — LIPID PANEL
CHOL/HDL RATIO: 4
Cholesterol: 179 mg/dL (ref 0–200)
HDL: 45.1 mg/dL (ref >39.00)
LDL CALC: 108 mg/dL — AB (ref 0–99)
NONHDL: 133.89
TRIGLYCERIDES: 130 mg/dL (ref 0.0–149.0)
VLDL: 26 mg/dL (ref 0.0–40.0)

## 2017-02-13 MED ORDER — SILDENAFIL CITRATE 20 MG PO TABS: 20.0000 mg | ORAL_TABLET | Freq: Three times a day (TID) | ORAL | 1 refills | Status: DC

## 2017-02-13 NOTE — Progress Notes (Addendum)
Subjective:    Patient ID: Randall Davis, male    DOB: 1951/02/19, 66 y.o.   MRN: [id number]  HPI  Patient presents for yearly preventative medicine examination. He is a pleasant 66 year old male who  has a past medical history of Allergy; Anxiety disorder; COPD (chronic obstructive pulmonary disease) ([place]); Diverticulitis; Dry eyes; ED (erectile dysfunction); Former smoker; Lactose intolerance; Pulmonary nodule; and Small bowel obstruction ([place]).  All immunizations and health maintenance protocols were reviewed with the patient and needed orders were placed.  Appropriate screening laboratory values were ordered for the patient including screening of hyperlipidemia, renal function and hepatic function. If indicated by BPH, a PSA was ordered.  Medication reconciliation,  past medical history, social history, problem list and allergies were reviewed in detail with the patient  Goals were established with regard to weight loss, exercise, and  diet in compliance with medications. He reports that he is active but not exercising on a regular basis. He tries to eat healthy.   Wt Readings from Last 3 Encounters:  02/13/17 221 lb 3.2 oz (100.3 kg)  12/16/16 216 lb 12.8 oz (98.3 kg)  12/02/16 216 lb (98 kg)    He does routine dental screens, but does not do routine vision screens. He is due for a colonoscopy his last was in 2014 with a recommended 3 year follow up  He has no acute issues   Review of Systems  Constitutional: Negative.   HENT: Negative.   Eyes: Negative.   Respiratory: Negative.   Cardiovascular: Negative.   Gastrointestinal: Negative.   Endocrine: Negative.   Genitourinary: Negative.   Musculoskeletal: Negative.   Skin: Negative.   Allergic/Immunologic: Negative.   Hematological: Negative.   Psychiatric/Behavioral: Negative.   All other systems reviewed and are negative.  Past Medical History:  Diagnosis Date  . Allergy   . Anxiety disorder   . COPD (chronic  obstructive pulmonary disease) ([place])   . Diverticulitis   . Dry eyes   . ED (erectile dysfunction)   . Former smoker   . Lactose intolerance   . Pulmonary nodule   . Small bowel obstruction [facility])     Social History   Social History  . Marital status: Married    Spouse name: N/A  . Number of children: 2  . Years of education: N/A   Occupational History  . Truck Chemical engineer   Social History Main Topics  . Smoking status: Former Smoker    Packs/day: 1.50    Years: 40.00    Types: Cigarettes    Quit date: 10/20/2001  . Smokeless tobacco: Never Used  . Alcohol use Yes     Comment: occ drink  . Drug use: No  . Sexual activity: Not on file   Other Topics Concern  . Not on file   Social History Narrative   Married 2000   2 Pensions consultant, former Programme researcher, broadcasting/film/video work   Engineering geologist to sail    Past Surgical History:  Procedure Laterality Date  . HEMORRHOID SURGERY    . LUNG SURGERY Left    due to pneumothorax in his 77s (with postoperative changes on L lung noted prev)  . VIDEO BRONCHOSCOPY  03/19/2012   Procedure: VIDEO BRONCHOSCOPY WITH FLUORO;  Surgeon: Elsie Stain, MD;  Location: [facility];  Service: Cardiopulmonary;  Laterality: N/A;    Family History  Problem Relation Age of Onset  . Asthma Mother   . Allergies Mother   .  Prostate cancer Father   . Colon cancer Neg Hx     Allergies  Allergen Reactions  . Milk-Related Compounds Diarrhea  . Novocain [Procaine Hcl]     Intolerant in distant past  . Spiriva [Tiotropium Bromide Monohydrate]     Urinary retention    Current Outpatient Prescriptions on File Prior to Visit  Medication Sig Dispense Refill  . albuterol (PROVENTIL HFA;VENTOLIN HFA) 108 (90 Base) MCG/ACT inhaler Inhale 2 puffs into the lungs every 6 (six) hours as needed. 18 g 5  . ALPRAZolam (XANAX) 1 MG tablet Take 0.5-1 tablets (0.5-1 mg total) by mouth at bedtime as needed for anxiety. Take as needed 10 tablet 0  .  aspirin-acetaminophen-caffeine (EXCEDRIN MIGRAINE) 250-250-65 MG tablet Take 2 tablets by mouth daily.    . beclomethasone (BECONASE AQ) 42 MCG/SPRAY nasal spray use 2 sprays in each nostril twice daily 50 g 12  . Multiple Vitamin (MULTIVITAMIN) tablet Take 1 tablet by mouth daily.    . naproxen sodium (ANAPROX) 220 MG tablet Take 220 mg by mouth daily as needed.    . NONFORMULARY OR COMPOUNDED ITEM neutrogenics "testosterone booster."    . sildenafil (REVATIO) 20 MG tablet Take two-four tablets as needed. 50 tablet 0  . tadalafil (CIALIS) 20 MG tablet Take 0.5-1 tablets (10-20 mg total) by mouth every other day as needed for erectile dysfunction. 5 tablet 11  . vitamin E 100 UNIT capsule Take 200 Units by mouth daily.    Marland Kitchen zinc gluconate 50 MG tablet Take 50 mg by mouth daily.     No current facility-administered medications on file prior to visit.     BP 138/70 (BP Location: Left Arm, Patient Position: Sitting, Cuff Size: Normal)   Temp 97.9 F (36.6 C) (Oral)   Ht 5\' 11"  (1.803 m)   Wt 221 lb 3.2 oz (100.3 kg)   BMI 30.85 kg/m      Objective:   Physical Exam  Constitutional: He is oriented to person, place, and time. He appears well-developed and well-nourished. No distress.  Obese    HENT:  Head: Normocephalic and atraumatic.  Right Ear: External ear normal.  Left Ear: External ear normal.  Nose: Nose normal.  Mouth/Throat: Oropharynx is clear and moist. No oropharyngeal exudate.  Eyes: Conjunctivae and EOM are normal. Pupils are equal, round, and reactive to light. Right eye exhibits no discharge. Left eye exhibits no discharge. No scleral icterus.  Neck: Normal range of motion. Neck supple. No JVD present. Carotid bruit is not present. No tracheal deviation present. No thyromegaly present.  Cardiovascular: Normal rate, regular rhythm, normal heart sounds and intact distal pulses.  Exam reveals no gallop and no friction rub.   No murmur heard. Pulmonary/Chest: Effort normal  and breath sounds normal. No stridor. No respiratory distress. He has no wheezes. He has no rales. He exhibits no tenderness.  Abdominal: Soft. Bowel sounds are normal. He exhibits no distension and no mass. There is no tenderness. There is no rebound and no guarding.  Genitourinary: Prostate normal. Rectal exam shows external hemorrhoid. Rectal exam shows anal tone normal and guaiac negative stool. Prostate is not enlarged and not tender.  Musculoskeletal: Normal range of motion. He exhibits no edema, tenderness or deformity.  Lymphadenopathy:    He has no cervical adenopathy.  Neurological: He is alert and oriented to person, place, and time. He has normal reflexes. He displays normal reflexes. No cranial nerve deficit. He exhibits normal muscle tone. Coordination normal.  Skin: Skin is  warm and dry. No rash noted. He is not diaphoretic. No erythema. No pallor.  Psychiatric: He has a normal mood and affect. His behavior is normal. Judgment and thought content normal.  Nursing note and vitals reviewed.     Assessment & Plan:  1. Routine general medical examination at a health care facility - Encouraged a heart healthy diet and exercise to lose weight.  - Basic metabolic panel - CBC with Differential/Platelet - Hepatic function panel - Lipid panel - TSH - PSA  2. Colon cancer screening  - Ambulatory referral to Gastroenterology  3. Medication refill  - sildenafil (REVATIO) 20 MG tablet; Take 1 tablet (20 mg total) by mouth 3 (three) times daily.  Dispense: 100 tablet; Refill: 1  Dorothyann Peng, NP

## 2017-02-17 ENCOUNTER — Other Ambulatory Visit: Payer: Self-pay | Admitting: Family Medicine

## 2017-03-02 ENCOUNTER — Telehealth: Payer: Self-pay | Admitting: Adult Health

## 2017-03-02 NOTE — Telephone Encounter (Signed)
Randall Davis pt would like for you to give him a call personal matter.

## 2017-03-03 NOTE — Telephone Encounter (Signed)
I contacted patient. He states another office needed his medical records and he was wondering if he could pick them up. He states he was notified by the front desk that we do not keep medical records here in the office. Patient states the other office has faxed over a medical release form to our office, and that he will let us know if he needs anything further.  Nothing further needed at this time. Thanks!

## 2017-04-23 ENCOUNTER — Encounter: Payer: Self-pay | Admitting: Gastroenterology

## 2017-05-07 LAB — BASIC METABOLIC PANEL
BUN: 25 — AB (ref 4–21)
Creatinine: 1 (ref 0.6–1.3)
GLUCOSE: 99
Potassium: 4.7 (ref 3.4–5.3)
SODIUM: 141 (ref 137–147)

## 2017-05-07 LAB — HEPATIC FUNCTION PANEL
ALT: 20 (ref 10–40)
AST: 17 (ref 14–40)
Alkaline Phosphatase: 88 (ref 25–125)
BILIRUBIN, TOTAL: 0.5

## 2017-05-07 LAB — CBC AND DIFFERENTIAL
HEMATOCRIT: 49 (ref 41–53)
HEMOGLOBIN: 17.7 — AB (ref 13.5–17.5)
NEUTROS ABS: 6
Platelets: 273 (ref 150–399)
WBC: 9.1

## 2017-05-07 LAB — LIPID PANEL
Cholesterol: 216 — AB (ref 0–200)
HDL: 53 (ref 35–70)
LDL Cholesterol: 136
TRIGLYCERIDES: 137 (ref 40–160)

## 2017-05-22 ENCOUNTER — Telehealth: Payer: Self-pay | Admitting: Adult Health

## 2017-05-22 ENCOUNTER — Other Ambulatory Visit: Payer: Self-pay | Admitting: Adult Health

## 2017-05-22 DIAGNOSIS — G8929 Other chronic pain: Secondary | ICD-10-CM

## 2017-05-22 DIAGNOSIS — M549 Dorsalgia, unspecified: Principal | ICD-10-CM

## 2017-05-22 NOTE — Telephone Encounter (Signed)
Patient presented to day request a referral be sent to [facility] for chiropractic services for an alignment once a month.  The patient is specifically requesting Dr. Laveda Abbe who is located in [place].  He believes the facility is called [facility].  Patient advised he knows that Dr. Laveda Abbe isn't in network with [facility] but he just needs the referral to be on file.    Patient would also like Cory to know there was a chest xray performed by patient's employer and that the chest xray was clear.  Also the patient is going to have colonoscopy the 21st of September.  He wants [name] to know he is being compliant.

## 2017-05-26 ENCOUNTER — Encounter: Payer: Self-pay | Admitting: Family Medicine

## 2017-06-26 ENCOUNTER — Ambulatory Visit (AMBULATORY_SURGERY_CENTER): Payer: Self-pay | Admitting: *Deleted

## 2017-06-26 VITALS — Ht 71.0 in | Wt 219.2 lb

## 2017-06-26 DIAGNOSIS — Z86010 Personal history of colonic polyps: Secondary | ICD-10-CM

## 2017-06-26 MED ORDER — NA SULFATE-K SULFATE-MG SULF 17.5-3.13-1.6 GM/177ML PO SOLN: 1.0000 [IU] | Freq: Once | ORAL | 0 refills | Status: AC

## 2017-06-26 NOTE — Progress Notes (Signed)
Eggs other than hard boiled causes GI upset or soy allergy known to patient  No issues with past sedation with any surgeries  or procedures, no intubation problems  No diet pills per patient No home 02 use per patient  No blood thinners per patient  Pt denies issues with constipation  No A fib or A flutter  EMMI video sent to pt's e mail pt. decline

## 2017-07-01 ENCOUNTER — Encounter: Payer: Self-pay | Admitting: Gastroenterology

## 2017-07-02 ENCOUNTER — Encounter: Payer: Self-pay | Admitting: Gastroenterology

## 2017-07-10 ENCOUNTER — Encounter: Payer: Self-pay | Admitting: Gastroenterology

## 2017-07-10 ENCOUNTER — Ambulatory Visit (AMBULATORY_SURGERY_CENTER): Payer: Medicare HMO | Admitting: Gastroenterology

## 2017-07-10 VITALS — BP 125/80 | HR 59 | Temp 97.1°F | Resp 10 | Ht 71.0 in | Wt 219.0 lb

## 2017-07-10 DIAGNOSIS — D122 Benign neoplasm of ascending colon: Secondary | ICD-10-CM

## 2017-07-10 DIAGNOSIS — J449 Chronic obstructive pulmonary disease, unspecified: Secondary | ICD-10-CM | POA: Diagnosis not present

## 2017-07-10 DIAGNOSIS — Z86010 Personal history of colonic polyps: Secondary | ICD-10-CM

## 2017-07-10 DIAGNOSIS — D123 Benign neoplasm of transverse colon: Secondary | ICD-10-CM | POA: Diagnosis not present

## 2017-07-10 DIAGNOSIS — D120 Benign neoplasm of cecum: Secondary | ICD-10-CM

## 2017-07-10 DIAGNOSIS — D125 Benign neoplasm of sigmoid colon: Secondary | ICD-10-CM | POA: Diagnosis not present

## 2017-07-10 DIAGNOSIS — D128 Benign neoplasm of rectum: Secondary | ICD-10-CM | POA: Diagnosis not present

## 2017-07-10 MED ORDER — SODIUM CHLORIDE 0.9 % IV SOLN: 500.0000 mL | INTRAVENOUS | Status: DC

## 2017-07-10 NOTE — Patient Instructions (Signed)
YOU HAD AN ENDOSCOPIC PROCEDURE TODAY AT [facility]:   Refer to the procedure report that was given to you for any specific questions about what was found during the examination.  If the procedure report does not answer your questions, please call your gastroenterologist to clarify.  If you requested that your care partner not be given the details of your procedure findings, then the procedure report has been included in a sealed envelope for you to review at your convenience later.  YOU SHOULD EXPECT: Some feelings of bloating in the abdomen. Passage of more gas than usual.  Walking can help get rid of the air that was put into your GI tract during the procedure and reduce the bloating. If you had a lower endoscopy (such as a colonoscopy or flexible sigmoidoscopy) you may notice spotting of blood in your stool or on the toilet paper. If you underwent a bowel prep for your procedure, you may not have a normal bowel movement for a few days.  Please Note:  You might notice some irritation and congestion in your nose or some drainage.  This is from the oxygen used during your procedure.  There is no need for concern and it should clear up in a day or so.  SYMPTOMS TO REPORT IMMEDIATELY:   Following lower endoscopy (colonoscopy or flexible sigmoidoscopy):  Excessive amounts of blood in the stool  Significant tenderness or worsening of abdominal pains  Swelling of the abdomen that is new, acute  Fever of 100F or higher  For urgent or emergent issues, a gastroenterologist can be reached at any hour by calling [phone].   DIET:  We do recommend a small meal at first, but then you may proceed to your regular diet.  Drink plenty of fluids but you should avoid alcoholic beverages for 24 hours.  MEDICATIONS: Continue present medications. No Ibuprofen, Naproxen, or other non-steroidal anti-inflammatory drugs for 2 weeks after polyp removal.  Please see handouts given to you by your  recovery nurse.  ACTIVITY:  You should plan to take it easy for the rest of today and you should NOT DRIVE or use heavy machinery until tomorrow (because of the sedation medicines used during the test).    FOLLOW UP: Our staff will call the number listed on your records the next business day following your procedure to check on you and address any questions or concerns that you may have regarding the information given to you following your procedure. If we do not reach you, we will leave a message.  However, if you are feeling well and you are not experiencing any problems, there is no need to return our call.  We will assume that you have returned to your regular daily activities without incident.  If any biopsies were taken you will be contacted by phone or by letter within the next 1-3 weeks.  Please call us at [phone] if you have not heard about the biopsies in 3 weeks.   Thank you for allowing [place] to provide for your healthcare needs today.  SIGNATURES/CONFIDENTIALITY: You and/or your care partner have signed paperwork which will be entered into your electronic medical record.  These signatures attest to the fact that that the information above on your After Visit Summary has been reviewed and is understood.  Full responsibility of the confidentiality of this discharge information lies with you and/or your care-partner.

## 2017-07-10 NOTE — Op Note (Signed)
[facility] Patient Name: Randall Davis Procedure Date: 07/10/2017 9:46 AM MRN: [id number] Endoscopist: Remo Lipps P. Armbruster MD, MD Age: 66 Referring MD:  Date of Birth: [birth year] Gender: Male Account #: [id number] Procedure:                Colonoscopy Indications:              Surveillance: Personal history of adenomatous                            polyps on last colonoscopy > 3 years ago Medicines:                Monitored Anesthesia Care Procedure:                Pre-Anesthesia Assessment:                           - Prior to the procedure, a History and Physical                            was performed, and patient medications and                            allergies were reviewed. The patient's tolerance of                            previous anesthesia was also reviewed. The risks                            and benefits of the procedure and the sedation                            options and risks were discussed with the patient.                            All questions were answered, and informed consent                            was obtained. Prior Anticoagulants: The patient has                            taken no previous anticoagulant or antiplatelet                            agents. ASA Grade Assessment: II - A patient with                            mild systemic disease. After reviewing the risks                            and benefits, the patient was deemed in                            satisfactory condition to undergo the procedure.  After obtaining informed consent, the colonoscope                            was passed under direct vision. Throughout the                            procedure, the patient's blood pressure, pulse, and                            oxygen saturations were monitored continuously. The                            Colonoscope was introduced through the anus and                            advanced to the the  cecum, identified by                            appendiceal orifice and ileocecal valve. The                            colonoscopy was performed without difficulty. The                            patient tolerated the procedure well. The quality                            of the bowel preparation was good. The ileocecal                            valve, appendiceal orifice, and rectum were                            photographed. Scope In: 9:49:52 AM Scope Out: 10:12:02 AM Scope Withdrawal Time: 0 hours 18 minutes 15 seconds  Total Procedure Duration: 0 hours 22 minutes 10 seconds  Findings:                 The perianal and digital rectal examinations were                            normal.                           Multiple medium-mouthed diverticula were found in                            the sigmoid colon.                           A single small angiodysplastic lesion was found in                            the cecum.  A 3 mm polyp was found in the ascending colon. The                            polyp was sessile. The polyp was removed with a                            cold snare. Resection and retrieval were complete.                           Two sessile polyps were found in the transverse                            colon. The polyps were 5 to 12 mm in size. These                            polyps were removed with a cold snare. Resection                            and retrieval were complete.                           A 5 mm polyp was found in the sigmoid colon. The                            polyp was sessile. The polyp was removed with a                            cold snare. Resection and retrieval were complete.                           A 5 mm polyp was found in the rectum. The polyp was                            sessile. The polyp was removed with a cold snare.                            Resection and retrieval were complete.                            Internal hemorrhoids were found during retroflexion.                           The exam was otherwise without abnormality. Complications:            No immediate complications. Estimated blood loss:                            Minimal. Estimated Blood Loss:     Estimated blood loss was minimal. Impression:               - Diverticulosis in the sigmoid colon.                           -  A single colonic angiodysplastic lesion.                           - One 3 mm polyp in the ascending colon, removed                            with a cold snare. Resected and retrieved.                           - Two 5 to 12 mm polyps in the transverse colon,                            removed with a cold snare. Resected and retrieved.                           - One 5 mm polyp in the sigmoid colon, removed with                            a cold snare. Resected and retrieved.                           - One 5 mm polyp in the rectum, removed with a cold                            snare. Resected and retrieved.                           - Internal hemorrhoids.                           - The examination was otherwise normal. Recommendation:           - Patient has a contact number available for                            emergencies. The signs and symptoms of potential                            delayed complications were discussed with the                            patient. Return to normal activities tomorrow.                            Written discharge instructions were provided to the                            patient.                           - Resume previous diet.                           - Continue present medications.                           -  Await pathology results.                           - Repeat colonoscopy is recommended for                            surveillance. The colonoscopy date will be                            determined after pathology results from today's                             exam become available for review.                           - No ibuprofen, naproxen, or other non-steroidal                            anti-inflammatory drugs for 2 weeks after polyp                            removal. Remo Lipps P. Armbruster MD, MD 07/10/2017 10:17:41 AM This report has been signed electronically.

## 2017-07-10 NOTE — Progress Notes (Signed)
Pt's states no medical or surgical changes since previsit or office visit. 

## 2017-07-10 NOTE — Progress Notes (Signed)
Report to PACU, RN, vss, BBS= Clear.  

## 2017-07-10 NOTE — Progress Notes (Signed)
Called to room to assist during endoscopic procedure.  Patient ID and intended procedure confirmed with present staff. Received instructions for my participation in the procedure from the performing physician.  

## 2017-07-13 ENCOUNTER — Telehealth: Payer: Self-pay

## 2017-07-13 NOTE — Telephone Encounter (Signed)
  Follow up Call-  Call back number 07/10/2017  Post procedure Call Back phone  # [phone]  Permission to leave phone message Yes  Some recent data might be hidden     Patient questions:  Do you have a fever, pain , or abdominal swelling? No. Pain Score  0 *  Have you tolerated food without any problems? Yes.    Have you been able to return to your normal activities? Yes.    Do you have any questions about your discharge instructions: Diet   No. Medications  No. Follow up visit  No.  Do you have questions or concerns about your Care? No.  Actions: * If pain score is 4 or above: No action needed, pain <4. Pt did verbalize having some bright red bleeding after a bowel movement. No other concerns, advised pt that it is normal to have a small amount bleeding after removing polyps and because of his hemorrhoids. Advised if bleeding gets worst or he starts to pass clots to give [place] a call back.

## 2017-07-14 ENCOUNTER — Encounter: Payer: Self-pay | Admitting: Gastroenterology

## 2017-09-02 ENCOUNTER — Ambulatory Visit: Payer: Medicare HMO | Admitting: Adult Health

## 2017-09-02 ENCOUNTER — Encounter: Payer: Self-pay | Admitting: Adult Health

## 2017-09-02 VITALS — BP 134/88 | Temp 97.6°F | Wt 222.0 lb

## 2017-09-02 DIAGNOSIS — J069 Acute upper respiratory infection, unspecified: Secondary | ICD-10-CM

## 2017-09-02 MED ORDER — PREDNISONE 10 MG PO TABS: ORAL_TABLET | ORAL | 0 refills | Status: DC

## 2017-09-02 MED ORDER — AMOXICILLIN-POT CLAVULANATE 875-125 MG PO TABS: 1.0000 | ORAL_TABLET | Freq: Two times a day (BID) | ORAL | 0 refills | Status: DC

## 2017-09-02 NOTE — Progress Notes (Signed)
Subjective:    Patient ID: Randall Davis, male    DOB: 1951/05/31, 65 y.o.   MRN: [id number]  Sinusitis  This is a new problem. The current episode started in the past 7 days. The problem has been gradually worsening since onset. The maximum temperature recorded prior to his arrival was 100.4 - 100.9 F. The fever has been present for less than 1 day. Associated symptoms include chills, congestion, coughing (productive ), ear pain, headaches, sinus pressure and a sore throat. Pertinent negatives include no neck pain or shortness of breath. Past treatments include oral decongestants and saline sprays. The treatment provided no relief.      Review of Systems  Constitutional: Positive for chills.  HENT: Positive for congestion, ear pain, postnasal drip, rhinorrhea, sinus pressure and sore throat.   Respiratory: Positive for cough (productive ) and wheezing. Negative for shortness of breath.   Cardiovascular: Negative.   Gastrointestinal: Negative.   Musculoskeletal: Negative for neck pain.  Skin: Negative.   Neurological: Positive for headaches.   Past Medical History:  Diagnosis Date  . Allergy   . Anxiety    with doctors only  . Anxiety disorder   . Blood transfusion without reported diagnosis    at birth  . COPD (chronic obstructive pulmonary disease) ([place])   . Diverticulitis   . Dry eyes   . ED (erectile dysfunction)   . Emphysema of lung ([place])   . Former smoker   . Lactose intolerance   . Pulmonary nodule    benign  . Small bowel obstruction ([facility])     Social History   Socioeconomic History  . Marital status: Married    Spouse name: Not on file  . Number of children: 2  . Years of education: Not on file  . Highest education level: Not on file  Social Needs  . Financial resource strain: Not on file  . Food insecurity - worry: Not on file  . Food insecurity - inability: Not on file  . Transportation needs - medical: Not on file  . Transportation needs -  non-medical: Not on file  Occupational History  . Occupation: Truck Education administrator: [employer]  Tobacco Use  . Smoking status: Former Smoker    Packs/day: 1.50    Years: 40.00    Pack years: 60.00    Types: Cigarettes    Last attempt to quit: 10/20/2001    Years since quitting: 15.8  . Smokeless tobacco: Never Used  Substance and Sexual Activity  . Alcohol use: Yes    Comment: occ drink 2 drinks twice weekly  . Drug use: No  . Sexual activity: Not on file  Other Topics Concern  . Not on file  Social History Narrative   Married 2000   2 Pensions consultant, former Programme researcher, broadcasting/film/video work   Engineering geologist to sail    Past Surgical History:  Procedure Laterality Date  . COLONOSCOPY    . HEMORRHOID SURGERY    . LUNG SURGERY Left    due to pneumothorax in his 52s (with postoperative changes on L lung noted prev)  . POLYPECTOMY      Family History  Problem Relation Age of Onset  . Asthma Mother   . Allergies Mother   . Breast cancer Mother   . Prostate cancer Father   . Colon cancer Neg Hx   . Colon polyps Neg Hx   . Rectal cancer Neg Hx   . Stomach cancer Neg  Hx   . Esophageal cancer Neg Hx     Allergies  Allergen Reactions  . Milk-Related Compounds Diarrhea  . Novocain [Procaine Hcl]     Intolerant in distant past  . Spiriva [Tiotropium Bromide Monohydrate]     Elevated PSA     Current Outpatient Medications on File Prior to Visit  Medication Sig Dispense Refill  . albuterol (PROVENTIL HFA;VENTOLIN HFA) 108 (90 Base) MCG/ACT inhaler Inhale 2 puffs into the lungs every 6 (six) hours as needed. 18 g 5  . aspirin-acetaminophen-caffeine (EXCEDRIN MIGRAINE) 250-250-65 MG tablet Take 2 tablets by mouth daily.    Marland Kitchen aspirin-acetaminophen-caffeine (EXCEDRIN MIGRAINE) 250-250-65 MG tablet Take 2 tablets by mouth every 8 (eight) hours as needed for headache.    Marland Kitchen BECONASE AQ 42 MCG/SPRAY nasal spray USE 2 SPRAY IN EACH NOSTRIL TWICE DAILY AS DIRECTED 25 g 5  . Pseudoephedrine  HCl (SUDAFED CONGESTION PO) Take 1 tablet by mouth daily as needed.     No current facility-administered medications on file prior to visit.     BP 134/88 (BP Location: Left Arm)   Temp 97.6 F (36.4 C) (Oral)   Wt 222 lb (100.7 kg)   BMI 30.96 kg/m       Objective:   Physical Exam  Constitutional: He is oriented to person, place, and time.  HENT:  Head: Normocephalic and atraumatic.  Right Ear: Hearing, tympanic membrane, external ear and ear canal normal.  Left Ear: Hearing, tympanic membrane, external ear and ear canal normal.  Nose: Mucosal edema and rhinorrhea present. Right sinus exhibits maxillary sinus tenderness and frontal sinus tenderness. Left sinus exhibits maxillary sinus tenderness and frontal sinus tenderness.  Mouth/Throat: Uvula is midline and oropharynx is clear and moist.  Cardiovascular: Normal rate, regular rhythm, normal heart sounds and intact distal pulses. Exam reveals no gallop and no friction rub.  No murmur heard. Pulmonary/Chest: Effort normal. No respiratory distress. He has wheezes (trace throughout ). He has no rales. He exhibits no tenderness.  Neurological: He is alert and oriented to person, place, and time.  Skin: Skin is warm and dry. No rash noted. No erythema. No pallor.  Psychiatric: He has a normal mood and affect. His behavior is normal. Judgment and thought content normal.  Nursing note and vitals reviewed.      Assessment & Plan:  1. Upper respiratory tract infection, unspecified type - amoxicillin-clavulanate (AUGMENTIN) 875-125 MG tablet; Take 1 tablet 2 (two) times daily by mouth.  Dispense: 14 tablet; Refill: 0 - predniSONE (DELTASONE) 10 MG tablet; 40 mg x 3 days, 20 mg x 3 days, 10 mg x 3 days  Dispense: 21 tablet; Refill: 0 - Stay hydrated and rest - Follow up if no improvement in the next 3-5 days or sooner if symptoms become worse   Dorothyann Peng, NP

## 2017-11-17 ENCOUNTER — Ambulatory Visit: Payer: Medicare HMO | Admitting: Adult Health

## 2017-11-17 ENCOUNTER — Encounter: Payer: Self-pay | Admitting: Adult Health

## 2017-11-17 VITALS — BP 118/80 | HR 80 | Temp 98.5°F | Wt 225.0 lb

## 2017-11-17 DIAGNOSIS — J069 Acute upper respiratory infection, unspecified: Secondary | ICD-10-CM | POA: Diagnosis not present

## 2017-11-17 MED ORDER — DOXYCYCLINE HYCLATE 100 MG PO CAPS: 100.0000 mg | ORAL_CAPSULE | Freq: Two times a day (BID) | ORAL | 0 refills | Status: DC

## 2017-11-17 MED ORDER — HYDROCODONE-HOMATROPINE 5-1.5 MG/5ML PO SYRP: 5.0000 mL | ORAL_SOLUTION | Freq: Three times a day (TID) | ORAL | 0 refills | Status: DC | PRN

## 2017-11-17 MED ORDER — PREDNISONE 10 MG PO TABS: ORAL_TABLET | ORAL | 0 refills | Status: DC

## 2017-11-17 NOTE — Progress Notes (Signed)
Subjective:    Patient ID: RAMY GRETH, male    DOB: 1951/01/16, 67 y.o.   MRN: [id number]  URI   This is a recurrent problem. The current episode started 1 to 4 weeks ago (2 weeks). The problem has been waxing and waning. The maximum temperature recorded prior to his arrival was 102 - 102.9 F. The fever has been present for 1 to 2 days. Associated symptoms include congestion, coughing (productive ), headaches, rhinorrhea, sinus pain, a sore throat and wheezing. Pertinent negatives include no chest pain, ear pain or nausea. He has tried decongestant for the symptoms. The treatment provided no relief.    Review of Systems  HENT: Positive for congestion, rhinorrhea, sinus pain and sore throat. Negative for ear pain.   Respiratory: Positive for cough (productive ) and wheezing.   Cardiovascular: Negative for chest pain.  Gastrointestinal: Negative for nausea.  Neurological: Positive for headaches.   Past Medical History:  Diagnosis Date  . Allergy   . Anxiety    with doctors only  . Anxiety disorder   . Blood transfusion without reported diagnosis    at birth  . COPD (chronic obstructive pulmonary disease) ([place])   . Diverticulitis   . Dry eyes   . ED (erectile dysfunction)   . Emphysema of lung ([place])   . Former smoker   . Lactose intolerance   . Pulmonary nodule    benign  . Small bowel obstruction ([facility])     Social History   Socioeconomic History  . Marital status: Married    Spouse name: Not on file  . Number of children: 2  . Years of education: Not on file  . Highest education level: Not on file  Social Needs  . Financial resource strain: Not on file  . Food insecurity - worry: Not on file  . Food insecurity - inability: Not on file  . Transportation needs - medical: Not on file  . Transportation needs - non-medical: Not on file  Occupational History  . Occupation: Truck Education administrator: [employer]  Tobacco Use  . Smoking status: Former Smoker     Packs/day: 1.50    Years: 40.00    Pack years: 60.00    Types: Cigarettes    Last attempt to quit: 10/20/2001    Years since quitting: 16.0  . Smokeless tobacco: Never Used  Substance and Sexual Activity  . Alcohol use: Yes    Comment: occ drink 2 drinks twice weekly  . Drug use: No  . Sexual activity: Not on file  Other Topics Concern  . Not on file  Social History Narrative   Married 2000   2 Pensions consultant, former Programme researcher, broadcasting/film/video work   Engineering geologist to sail    Past Surgical History:  Procedure Laterality Date  . COLONOSCOPY    . HEMORRHOID SURGERY    . LUNG SURGERY Left    due to pneumothorax in his 21s (with postoperative changes on L lung noted prev)  . POLYPECTOMY    . VIDEO BRONCHOSCOPY  03/19/2012   Procedure: VIDEO BRONCHOSCOPY WITH FLUORO;  Surgeon: Elsie Stain, MD;  Location: [facility];  Service: Cardiopulmonary;  Laterality: N/A;    Family History  Problem Relation Age of Onset  . Asthma Mother   . Allergies Mother   . Breast cancer Mother   . Prostate cancer Father   . Colon cancer Neg Hx   . Colon polyps Neg Hx   .  Rectal cancer Neg Hx   . Stomach cancer Neg Hx   . Esophageal cancer Neg Hx     Allergies  Allergen Reactions  . Milk-Related Compounds Diarrhea  . Novocain [Procaine Hcl]     Intolerant in distant past  . Spiriva [Tiotropium Bromide Monohydrate]     Elevated PSA     Current Outpatient Medications on File Prior to Visit  Medication Sig Dispense Refill  . albuterol (PROVENTIL HFA;VENTOLIN HFA) 108 (90 Base) MCG/ACT inhaler Inhale 2 puffs into the lungs every 6 (six) hours as needed. 18 g 5  . aspirin-acetaminophen-caffeine (EXCEDRIN MIGRAINE) 250-250-65 MG tablet Take 2 tablets by mouth every 8 (eight) hours as needed for headache.    Marland Kitchen BECONASE AQ 42 MCG/SPRAY nasal spray USE 2 SPRAY IN EACH NOSTRIL TWICE DAILY AS DIRECTED 25 g 5  . Pseudoephedrine HCl (SUDAFED CONGESTION PO) Take 1 tablet by mouth daily as needed.     No current  facility-administered medications on file prior to visit.     BP 118/80 (BP Location: Left Arm)   Pulse 80   Temp 98.5 F (36.9 C) (Oral)   Wt 225 lb (102.1 kg)   SpO2 97%   BMI 31.38 kg/m       Objective:   Physical Exam  Constitutional: He is oriented to person, place, and time. He appears well-developed and well-nourished. No distress.  HENT:  Head: Normocephalic and atraumatic.  Right Ear: Hearing, tympanic membrane, external ear and ear canal normal.  Left Ear: Hearing, tympanic membrane, external ear and ear canal normal.  Nose: Mucosal edema and rhinorrhea present. Right sinus exhibits frontal sinus tenderness. Left sinus exhibits frontal sinus tenderness.  Mouth/Throat: Uvula is midline, oropharynx is clear and moist and mucous membranes are normal. No oropharyngeal exudate.  Eyes: Conjunctivae and EOM are normal. Pupils are equal, round, and reactive to light. Right eye exhibits no discharge. Left eye exhibits no discharge. No scleral icterus.  Neck: Normal range of motion. Neck supple.  Cardiovascular: Normal rate, regular rhythm, normal heart sounds and intact distal pulses. Exam reveals no gallop and no friction rub.  No murmur heard. Pulmonary/Chest: Effort normal and breath sounds normal. No respiratory distress. He has no wheezes. He has no rales. He exhibits no tenderness.  Musculoskeletal: Normal range of motion. He exhibits no edema, tenderness or deformity.  Neurological: He is alert and oriented to person, place, and time.  Skin: Skin is warm and dry. No rash noted. He is not diaphoretic. No erythema. No pallor.  Psychiatric: He has a normal mood and affect. His behavior is normal. Judgment and thought content normal.  Nursing note and vitals reviewed.     Assessment & Plan:  1. Upper respiratory tract infection, unspecified type - doxycycline (VIBRAMYCIN) 100 MG capsule; Take 1 capsule (100 mg total) by mouth 2 (two) times daily.  Dispense: 14 capsule;  Refill: 0 - predniSONE (DELTASONE) 10 MG tablet; 40 mg x 3 days, 20 mg x 3 days, 10 mg x 3 days  Dispense: 21 tablet; Refill: 0 - HYDROcodone-homatropine (HYCODAN) 5-1.5 MG/5ML syrup; Take 5 mLs by mouth every 8 (eight) hours as needed for cough.  Dispense: 120 mL; Refill: 0 - Stay hydrated and rest  - Follow up if no improvement in the next 2-3 days   Dorothyann Peng, NP

## 2018-02-19 ENCOUNTER — Other Ambulatory Visit: Payer: Self-pay | Admitting: Adult Health

## 2018-02-19 ENCOUNTER — Telehealth: Payer: Self-pay | Admitting: Family Medicine

## 2018-02-22 ENCOUNTER — Other Ambulatory Visit: Payer: Self-pay | Admitting: Adult Health

## 2018-02-22 MED ORDER — BECLOMETHASONE DIPROP MONOHYD 42 MCG/SPRAY NA SUSP: NASAL | 5 refills | Status: DC

## 2018-02-23 NOTE — Telephone Encounter (Signed)
Sent to the pharmacy by e-scribe. 

## 2018-02-23 NOTE — Telephone Encounter (Signed)
Ok to refill + 1  

## 2018-02-23 NOTE — Telephone Encounter (Signed)
Duplicate message.  Filled on 02/22/18

## 2018-03-02 MED ORDER — BECLOMETHASONE DIPROP MONOHYD 42 MCG/SPRAY NA SUSP: 2.0000 | Freq: Two times a day (BID) | NASAL | 5 refills | Status: DC

## 2018-03-02 NOTE — Telephone Encounter (Signed)
Sent to the pharmacy by e-scribe.  Per [facility],  Script that was sent in on 02/22/18 went to the wrong pharmacy.  Corrected and sent to [facility].  No further action required.

## 2018-03-02 NOTE — Telephone Encounter (Signed)
Patient calling and checking status, would like to know this was denied. Please advise. Call back [phone]

## 2018-03-02 NOTE — Addendum Note (Signed)
Addended by: Miles Costain T on: 03/02/2018 10:33 AM   Modules accepted: Orders

## 2018-03-02 NOTE — Telephone Encounter (Signed)
Left a message for a return call.  Medication was not denied.  Sent in on 02/22/18 for 6 months.

## 2018-03-05 ENCOUNTER — Other Ambulatory Visit: Payer: Self-pay | Admitting: Adult Health

## 2018-03-05 ENCOUNTER — Telehealth: Payer: Self-pay | Admitting: Family Medicine

## 2018-03-05 MED ORDER — FLUTICASONE PROPIONATE 50 MCG/ACT NA SUSP: 2.0000 | Freq: Every day | NASAL | 6 refills | Status: DC

## 2018-03-05 NOTE — Telephone Encounter (Signed)
Received a fax from [facility].  Heber [place] is not covered by patient's insurance.  The preferred alternative is budesonide, flunisolide, fluticasone, mometasone.  Please advise.

## 2018-03-05 NOTE — Telephone Encounter (Signed)
Noted  

## 2018-03-05 NOTE — Telephone Encounter (Signed)
flonase sent in

## 2018-04-27 ENCOUNTER — Encounter: Payer: Self-pay | Admitting: Adult Health

## 2018-04-27 ENCOUNTER — Ambulatory Visit (INDEPENDENT_AMBULATORY_CARE_PROVIDER_SITE_OTHER): Payer: 59 | Admitting: Adult Health

## 2018-04-27 VITALS — BP 116/84 | Temp 97.9°F | Wt 233.0 lb

## 2018-04-27 DIAGNOSIS — D492 Neoplasm of unspecified behavior of bone, soft tissue, and skin: Secondary | ICD-10-CM

## 2018-04-27 DIAGNOSIS — R600 Localized edema: Secondary | ICD-10-CM

## 2018-04-27 NOTE — Progress Notes (Signed)
Subjective:    Patient ID: Randall Davis, male    DOB: 04-21-1951, 67 y.o.   MRN: [id number]  HPI 67 year old male who  has a past medical history of Allergy, Anxiety, Anxiety disorder, Blood transfusion without reported diagnosis, COPD (chronic obstructive pulmonary disease) ([place]), Diverticulitis, Dry eyes, ED (erectile dysfunction), Emphysema of lung ([place]), Former smoker, Lactose intolerance, Pulmonary nodule, and Small bowel obstruction ([place]).  He presents to the office today for multiple issues.   1.  Bilateral lower extremity edema x6 months.  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  Denies any changes in diet but has been drinking more Gatorade to stay hydrated.  Denies any pain, calf tenderness, redness or warmth.  Has not had any trauma to the area.  Has not been experiencing any chest pain or shortness of breath.   2.  He has a "bump" on the crown of his head that he would like frozen off. He is sure of how long this has been present.  Reports that it bleeds when he picks the scab off   Review of Systems See HPI   Past Medical History:  Diagnosis Date  . Allergy   . Anxiety    with doctors only  . Anxiety disorder   . Blood transfusion without reported diagnosis    at birth  . COPD (chronic obstructive pulmonary disease) ([place])   . Diverticulitis   . Dry eyes   . ED (erectile dysfunction)   . Emphysema of lung ([place])   . Former smoker   . Lactose intolerance   . Pulmonary nodule    benign  . Small bowel obstruction ([facility])     Social History   Socioeconomic History  . Marital status: Married    Spouse name: Not on file  . Number of children: 2  . Years of education: Not on file  . Highest education level: Not on file  Occupational History  . Occupation: Truck Education administrator: Personal assistant  Social Needs  . Financial resource strain: Not on file  . Food insecurity:    Worry:  Not on file    Inability: Not on file  . Transportation needs:    Medical: Not on file    Non-medical: Not on file  Tobacco Use  . Smoking status: Former Smoker    Packs/day: 1.50    Years: 40.00    Pack years: 60.00    Types: Cigarettes    Last attempt to quit: 10/20/2001    Years since quitting: 16.5  . Smokeless tobacco: Never Used  Substance and Sexual Activity  . Alcohol use: Yes    Comment: occ drink 2 drinks twice weekly  . Drug use: No  . Sexual activity: Not on file  Lifestyle  . Physical activity:    Days per week: Not on file    Minutes per session: Not on file  . Stress: Not on file  Relationships  . Social connections:    Talks on phone: Not on file    Gets together: Not on file    Attends religious service: Not on file    Active member of club or organization: Not on file    Attends meetings of clubs or organizations: Not on file    Relationship status: Not on file  . Intimate partner violence:    Fear of current or ex partner: Not on  file    Emotionally abused: Not on file    Physically abused: Not on file    Forced sexual activity: Not on file  Other Topics Concern  . Not on file  Social History Narrative   Married 2000   2 Pensions consultant, former Programme researcher, broadcasting/film/video work   Engineering geologist to sail    Past Surgical History:  Procedure Laterality Date  . COLONOSCOPY    . HEMORRHOID SURGERY    . LUNG SURGERY Left    due to pneumothorax in his 76s (with postoperative changes on L lung noted prev)  . POLYPECTOMY    . VIDEO BRONCHOSCOPY  03/19/2012   Procedure: VIDEO BRONCHOSCOPY WITH FLUORO;  Surgeon: Elsie Stain, MD;  Location: [facility];  Service: Cardiopulmonary;  Laterality: N/A;    Family History  Problem Relation Age of Onset  . Asthma Mother   . Allergies Mother   . Breast cancer Mother   . Prostate cancer Father   . Colon cancer Neg Hx   . Colon polyps Neg Hx   . Rectal cancer Neg Hx   . Stomach cancer Neg Hx   . Esophageal cancer Neg Hx      Allergies  Allergen Reactions  . Milk-Related Compounds Diarrhea  . Novocain [Procaine Hcl]     Intolerant in distant past  . Spiriva [Tiotropium Bromide Monohydrate]     Elevated PSA     Current Outpatient Medications on File Prior to Visit  Medication Sig Dispense Refill  . aspirin-acetaminophen-caffeine (EXCEDRIN MIGRAINE) 250-250-65 MG tablet Take 2 tablets by mouth every 8 (eight) hours as needed for headache.    . albuterol (PROVENTIL HFA;VENTOLIN HFA) 108 (90 Base) MCG/ACT inhaler INHALE 2 PUFFS INTO THE LUNGS EVERY 6 HOURS AS NEEDED (Patient not taking: Reported on 04/27/2018) 8.5 g 1  . fluticasone (FLONASE) 50 MCG/ACT nasal spray Place 2 sprays into both nostrils daily. (Patient not taking: Reported on 04/27/2018) 16 g 6   No current facility-administered medications on file prior to visit.     BP 116/84 (BP Location: Left Arm)   Temp 97.9 F (36.6 C) (Oral)   Wt 233 lb (105.7 kg)   BMI 32.50 kg/m       Objective:   Physical Exam  Constitutional: He is oriented to person, place, and time. He appears well-developed and well-nourished. No distress.  Cardiovascular: Normal rate, regular rhythm, normal heart sounds and intact distal pulses. Exam reveals no gallop and no friction rub.  No murmur heard. Pulmonary/Chest: Effort normal and breath sounds normal. No stridor. No respiratory distress. He has no wheezes. He has no rales. He exhibits no tenderness.  Musculoskeletal: Normal range of motion. He exhibits edema. He exhibits no tenderness or deformity.  He is very mild nonpitting edema noted in bilateral ankles.  No calf tightness, tenderness, warmth, redness noted.  Neurological: He is alert and oriented to person, place, and time.  Skin: Skin is warm and dry. No rash noted. He is not diaphoretic. No erythema. No pallor.  Is a neoplasm of uncertain origin ( appears as AK) the size of a pencil eraser on the crown of his head.  Nursing note and vitals reviewed.      Assessment & Plan:  1. Bilateral lower extremity edema -Doubt cardiac in origin.  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  Back on Gatorade and eat a low-sodium diet.  Drink plenty of water throughout the day  2. Skin neoplasm - Verbal consent received for  cryotherapy. Liquid nitrogen was applied to neoplasm. Patient tolerated well. Advised tylenol for pain control. Small blister may form and to be watchful for signs of infection. Follow up as needed  Dorothyann Peng, NP

## 2018-05-07 ENCOUNTER — Ambulatory Visit (INDEPENDENT_AMBULATORY_CARE_PROVIDER_SITE_OTHER): Payer: 59

## 2018-05-07 ENCOUNTER — Encounter: Payer: Self-pay | Admitting: Adult Health

## 2018-05-07 ENCOUNTER — Ambulatory Visit (INDEPENDENT_AMBULATORY_CARE_PROVIDER_SITE_OTHER): Payer: 59 | Admitting: Adult Health

## 2018-05-07 VITALS — BP 120/82 | Temp 97.8°F | Ht 70.0 in | Wt 231.0 lb

## 2018-05-07 DIAGNOSIS — M25572 Pain in left ankle and joints of left foot: Secondary | ICD-10-CM | POA: Diagnosis not present

## 2018-05-07 DIAGNOSIS — J449 Chronic obstructive pulmonary disease, unspecified: Secondary | ICD-10-CM

## 2018-05-07 DIAGNOSIS — J439 Emphysema, unspecified: Secondary | ICD-10-CM | POA: Diagnosis not present

## 2018-05-07 DIAGNOSIS — Z0001 Encounter for general adult medical examination with abnormal findings: Secondary | ICD-10-CM

## 2018-05-07 DIAGNOSIS — Z125 Encounter for screening for malignant neoplasm of prostate: Secondary | ICD-10-CM

## 2018-05-07 DIAGNOSIS — Z0000 Encounter for general adult medical examination without abnormal findings: Secondary | ICD-10-CM

## 2018-05-07 DIAGNOSIS — Z23 Encounter for immunization: Secondary | ICD-10-CM | POA: Diagnosis not present

## 2018-05-07 DIAGNOSIS — M19072 Primary osteoarthritis, left ankle and foot: Secondary | ICD-10-CM | POA: Diagnosis not present

## 2018-05-07 LAB — HEPATIC FUNCTION PANEL
ALK PHOS: 77 U/L (ref 39–117)
ALT: 19 U/L (ref 0–53)
AST: 12 U/L (ref 0–37)
Albumin: 4.1 g/dL (ref 3.5–5.2)
Bilirubin, Direct: 0.1 mg/dL (ref 0.0–0.3)
Total Bilirubin: 0.7 mg/dL (ref 0.2–1.2)
Total Protein: 6.4 g/dL (ref 6.0–8.3)

## 2018-05-07 LAB — CBC WITH DIFFERENTIAL/PLATELET
BASOS ABS: 0.1 10*3/uL (ref 0.0–0.1)
BASOS PCT: 0.9 % (ref 0.0–3.0)
Eosinophils Absolute: 0.2 10*3/uL (ref 0.0–0.7)
Eosinophils Relative: 3.1 % (ref 0.0–5.0)
HEMATOCRIT: 51.2 % (ref 39.0–52.0)
HEMOGLOBIN: 17.5 g/dL — AB (ref 13.0–17.0)
LYMPHS PCT: 29.7 % (ref 12.0–46.0)
Lymphs Abs: 2 10*3/uL (ref 0.7–4.0)
MCHC: 34.2 g/dL (ref 30.0–36.0)
MCV: 91.3 fl (ref 78.0–100.0)
MONOS PCT: 11.5 % (ref 3.0–12.0)
Monocytes Absolute: 0.8 10*3/uL (ref 0.1–1.0)
NEUTROS ABS: 3.6 10*3/uL (ref 1.4–7.7)
Neutrophils Relative %: 54.8 % (ref 43.0–77.0)
PLATELETS: 285 10*3/uL (ref 150.0–400.0)
RBC: 5.61 Mil/uL (ref 4.22–5.81)
RDW: 13.1 % (ref 11.5–15.5)
WBC: 6.6 10*3/uL (ref 4.0–10.5)

## 2018-05-07 LAB — PSA: PSA: 3.08 ng/mL (ref 0.10–4.00)

## 2018-05-07 LAB — LIPID PANEL
CHOL/HDL RATIO: 4
Cholesterol: 176 mg/dL (ref 0–200)
HDL: 42 mg/dL (ref >39.00)
LDL CALC: 96 mg/dL (ref 0–99)
NONHDL: 134.09
Triglycerides: 191 mg/dL — ABNORMAL HIGH (ref 0.0–149.0)
VLDL: 38.2 mg/dL (ref 0.0–40.0)

## 2018-05-07 LAB — BASIC METABOLIC PANEL
BUN: 27 mg/dL — ABNORMAL HIGH (ref 6–23)
CALCIUM: 9.2 mg/dL (ref 8.4–10.5)
CHLORIDE: 102 meq/L (ref 96–112)
CO2: 30 meq/L (ref 19–32)
Creatinine, Ser: 0.99 mg/dL (ref 0.40–1.50)
GFR: 79.81 mL/min (ref >60.00)
Glucose, Bld: 99 mg/dL (ref 70–99)
POTASSIUM: 4.8 meq/L (ref 3.5–5.1)
SODIUM: 138 meq/L (ref 135–145)

## 2018-05-07 LAB — TSH: TSH: 1.1 u[IU]/mL (ref 0.35–4.50)

## 2018-05-07 NOTE — Progress Notes (Signed)
Subjective:    Patient ID: Randall Davis, male    DOB: [birth year], 67 y.o.   MRN: [id number]  HPI Patient presents for yearly preventative medicine examination. He is a  Pleasant 67 year old male who  has a past medical history of Allergy, Anxiety, Anxiety disorder, Blood transfusion without reported diagnosis, COPD (chronic obstructive pulmonary disease) ([place]), Diverticulitis, Dry eyes, ED (erectile dysfunction), Emphysema of lung ([place]), Former smoker, Lactose intolerance, Pulmonary nodule, and Small bowel obstruction ([place]).  COPD - uses rescue inhaler as needed. Denies any current SOB  Seasonal Allergies - uses Flonase PRN   All immunizations and health maintenance protocols were reviewed with the patient and needed orders were placed. He is due for pneumovax   Appropriate screening laboratory values were ordered for the patient including screening of hyperlipidemia, renal function and hepatic function. If indicated by BPH, a PSA was ordered.  Medication reconciliation,  past medical history, social history, problem list and allergies were reviewed in detail with the patient  Goals were established with regard to weight loss, exercise, and  diet in compliance with medications  Is up-to-date on his routine colonoscopy, his next visit in 2021.  He participates in routine dental care but does not do vision screens.  BP Readings from Last 3 Encounters:  05/07/18 120/82  04/27/18 116/84  11/17/17 118/80   His only acute complaint is that left ankle pain, swelling and bruising. Reports that he was on his sail boat about two weeks ago when he got tangled in a rope and got tripped up, injuring his left ankle. He reports some decreased ROM but this is improving as tie goes on. Also reports pain and swelling is improving. Has some discomfort with walking but reports he is able to use the clutch on his truck without difficulty.   Review of Systems  Constitutional: Negative.   HENT: Negative.    Eyes: Negative.   Respiratory: Negative.   Cardiovascular: Negative.   Gastrointestinal: Negative.   Endocrine: Negative.   Genitourinary: Negative.   Musculoskeletal: Positive for arthralgias.  Skin: Negative.   Allergic/Immunologic: Negative.   Neurological: Negative.   Hematological: Negative.   Psychiatric/Behavioral: Negative.   All other systems reviewed and are negative.  Past Medical History:  Diagnosis Date  . Allergy   . Anxiety    with doctors only  . Anxiety disorder   . Blood transfusion without reported diagnosis    at birth  . COPD (chronic obstructive pulmonary disease) ([place])   . Diverticulitis   . Dry eyes   . ED (erectile dysfunction)   . Emphysema of lung ([place])   . Former smoker   . Lactose intolerance   . Pulmonary nodule    benign  . Small bowel obstruction ([facility])     Social History   Socioeconomic History  . Marital status: Married    Spouse name: Not on file  . Number of children: 2  . Years of education: Not on file  . Highest education level: Not on file  Occupational History  . Occupation: Truck Education administrator: Personal assistant  Social Needs  . Financial resource strain: Not on file  . Food insecurity:    Worry: Not on file    Inability: Not on file  . Transportation needs:    Medical: Not on file    Non-medical: Not on file  Tobacco Use  . Smoking status: Former Smoker    Packs/day: 1.50    Years:  40.00    Pack years: 60.00    Types: Cigarettes    Last attempt to quit: 10/20/2001    Years since quitting: 16.5  . Smokeless tobacco: Never Used  Substance and Sexual Activity  . Alcohol use: Yes    Comment: occ drink 2 drinks twice weekly  . Drug use: No  . Sexual activity: Not on file  Lifestyle  . Physical activity:    Days per week: Not on file    Minutes per session: Not on file  . Stress: Not on file  Relationships  . Social connections:    Talks on phone: Not on file    Gets together: Not on file     Attends religious service: Not on file    Active member of club or organization: Not on file    Attends meetings of clubs or organizations: Not on file    Relationship status: Not on file  . Intimate partner violence:    Fear of current or ex partner: Not on file    Emotionally abused: Not on file    Physically abused: Not on file    Forced sexual activity: Not on file  Other Topics Concern  . Not on file  Social History Narrative   Married 2000   2 Pensions consultant, former Programme researcher, broadcasting/film/video work   Engineering geologist to sail    Past Surgical History:  Procedure Laterality Date  . COLONOSCOPY    . HEMORRHOID SURGERY    . LUNG SURGERY Left    due to pneumothorax in his 66s (with postoperative changes on L lung noted prev)  . POLYPECTOMY    . VIDEO BRONCHOSCOPY  03/19/2012   Procedure: VIDEO BRONCHOSCOPY WITH FLUORO;  Surgeon: Elsie Stain, MD;  [facility];  Service: Cardiopulmonary;  Laterality: N/A;    Family History  Problem Relation Age of Onset  . Asthma Mother   . Allergies Mother   . Breast cancer Mother   . Prostate cancer Father   . Colon cancer Neg Hx   . Colon polyps Neg Hx   . Rectal cancer Neg Hx   . Stomach cancer Neg Hx   . Esophageal cancer Neg Hx     Allergies  Allergen Reactions  . Milk-Related Compounds Diarrhea  . Novocain [Procaine Hcl]     Intolerant in distant past  . Spiriva [Tiotropium Bromide Monohydrate]     Elevated PSA     Current Outpatient Medications on File Prior to Visit  Medication Sig Dispense Refill  . albuterol (PROVENTIL HFA;VENTOLIN HFA) 108 (90 Base) MCG/ACT inhaler INHALE 2 PUFFS INTO THE LUNGS EVERY 6 HOURS AS NEEDED (Patient not taking: Reported on 04/27/2018) 8.5 g 1  . aspirin-acetaminophen-caffeine (EXCEDRIN MIGRAINE) 250-250-65 MG tablet Take 2 tablets by mouth every 8 (eight) hours as needed for headache.    . fluticasone (FLONASE) 50 MCG/ACT nasal spray Place 2 sprays into both nostrils daily. (Patient not taking: Reported on  04/27/2018) 16 g 6   No current facility-administered medications on file prior to visit.     There were no vitals taken for this visit.      Objective:   Physical Exam  Constitutional: He is oriented to person, place, and time. He appears well-developed and well-nourished. No distress.  HENT:  Head: Normocephalic and atraumatic.  Right Ear: External ear normal.  Left Ear: External ear normal.  Nose: Nose normal.  Mouth/Throat: Oropharynx is clear and moist. Abnormal dentition. No oropharyngeal exudate.  Eyes: Pupils  are equal, round, and reactive to light. Conjunctivae and EOM are normal. Right eye exhibits no discharge. Left eye exhibits no discharge. No scleral icterus.  Neck: Normal range of motion. Neck supple. No JVD present. No tracheal deviation present. No thyromegaly present.  Cardiovascular: Normal rate, regular rhythm, normal heart sounds and intact distal pulses. Exam reveals no gallop and no friction rub.  No murmur heard. Pulmonary/Chest: Effort normal. No stridor. No respiratory distress. Decreased breath sounds: slightly decreased in bilateral lower lobes  He has no wheezes. He has no rhonchi. He has no rales. He exhibits no tenderness.  Abdominal: Soft. Bowel sounds are normal. He exhibits no distension and no mass. There is no tenderness. There is no rebound and no guarding. A hernia (umbilical ) is present.  Musculoskeletal: Normal range of motion. He exhibits no edema, tenderness or deformity.  Lymphadenopathy:    He has no cervical adenopathy.  Neurological: He is alert and oriented to person, place, and time. He displays normal reflexes. No cranial nerve deficit or sensory deficit. He exhibits normal muscle tone. Coordination normal.  Skin: Skin is warm and dry. Capillary refill takes less than 2 seconds. No rash noted. He is not diaphoretic. No erythema. No pallor.  Noted bruising throughout left lower ankle foot.  He does have tenderness throughout with deep  palpation.  Does not appear to have any loss of range of motion  Psychiatric: He has a normal mood and affect. His behavior is normal. Judgment and thought content normal.  Nursing note and vitals reviewed.     Assessment & Plan:  1. Routine general medical examination at a health care facility - Encouraged heart healthy diet and exercise.  - Basic metabolic panel - CBC with Differential/Platelet - Lipid panel - Hepatic function panel - TSH  2. Chronic obstructive pulmonary disease, unspecified COPD type ([place])  - DG Chest 2 View  3. Prostate cancer screening  - PSA  4. Acute left ankle pain -Significant bruising despite being 44-week old injury.  Will get x-rays to rule out fracture - DG Foot Complete Left - DG Ankle Complete Left  5. Need for 23-polyvalent pneumococcal polysaccharide vaccine  - Pneumococcal polysaccharide vaccine 23-valent greater than or equal to 2yo subcutaneous/IM   Dorothyann Peng, NP

## 2018-05-10 ENCOUNTER — Ambulatory Visit: Payer: Self-pay | Admitting: *Deleted

## 2018-05-10 NOTE — Telephone Encounter (Signed)
Pt called back for results. Addressed in result notes.

## 2018-06-01 ENCOUNTER — Telehealth: Payer: Self-pay | Admitting: Adult Health

## 2018-06-01 NOTE — Telephone Encounter (Signed)
Copied from [place] [id number]. Topic: General - Other >> Jun 01, 2018  3:06 PM Yvette Rack wrote: Reason for CRM: Pt states his employer gave him a physical on 05/25/18 and the lab results came back but he is not sure what the numbers mean. Pt states some of the numbers show that he border line and he would like to speak with a nurse. Pt requests a call back from a nurse. Cb# [phone]

## 2018-06-01 NOTE — Telephone Encounter (Signed)
Patient has labs drawn yearly at work- his levels glucose, BUN and LDL were a little more elevated than what he had on his labs in the office- he just wanted to make sure he wasn't that far off. Patient will need a letter stating he dropped his copy off for provider review. He will bring it in. Discussed whys to bring glucose and LDL numbers down- proper diet and portion control.

## 2018-06-08 NOTE — Telephone Encounter (Signed)
I have not received any paperwork from [place]. Can we make sure he brought it in for review?

## 2018-06-10 NOTE — Telephone Encounter (Signed)
Called and spoke with pt and he stated that there is nothing for [place] to do with the papers he will be dropping off except scanning it in his chart.  He will try and drop them off this week.  Nothing further is needed.

## 2018-08-17 ENCOUNTER — Other Ambulatory Visit: Payer: Self-pay | Admitting: Adult Health

## 2018-08-18 NOTE — Telephone Encounter (Signed)
Cory, this has not been filled in over 1 year.  Please advise. 

## 2018-10-02 DIAGNOSIS — S90934A Unspecified superficial injury of right lesser toe(s), initial encounter: Secondary | ICD-10-CM | POA: Diagnosis not present

## 2018-11-18 IMAGING — DX DG ANKLE COMPLETE 3+V*L*
3 series · 3 of 3 positions shown · non-contrast
Comparison: None

CLINICAL DATA: LEFT foot and ankle pain with swelling for 2 weeks
after striking foot on a tool box

EXAM:
LEFT ANKLE COMPLETE - 3+ VIEW

[ankle ap]
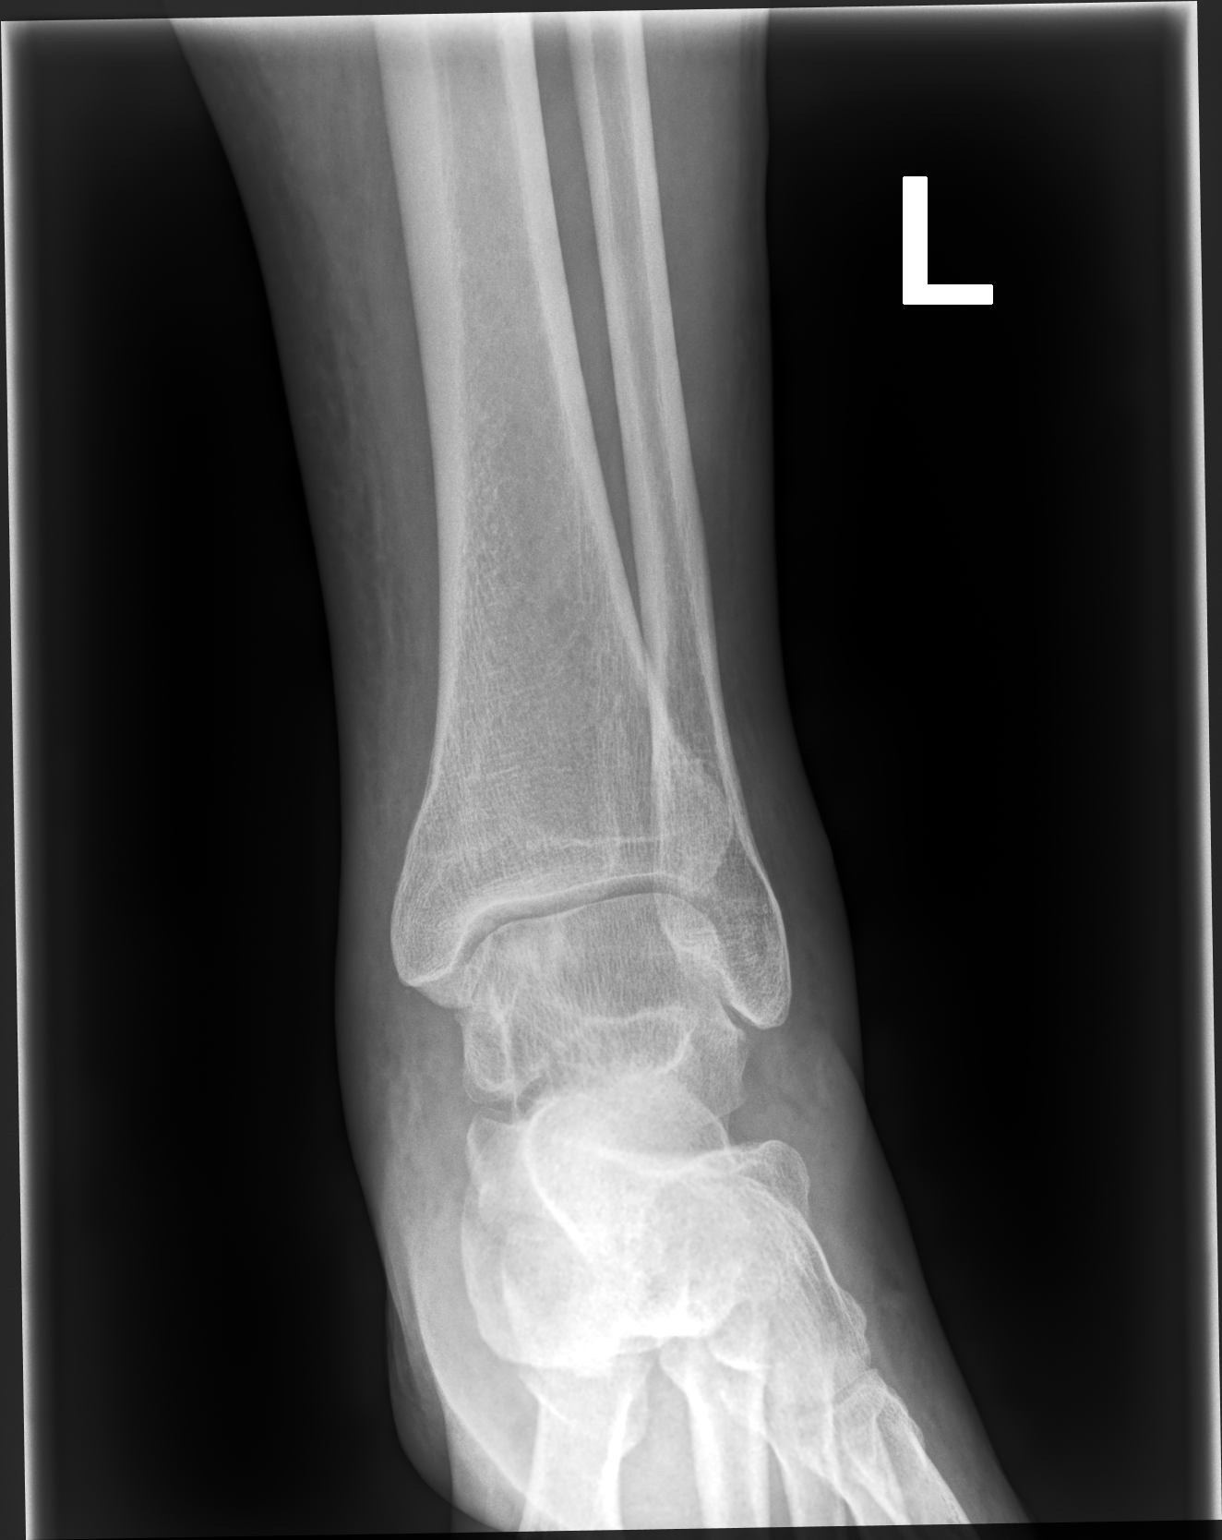

[ankle mlo]
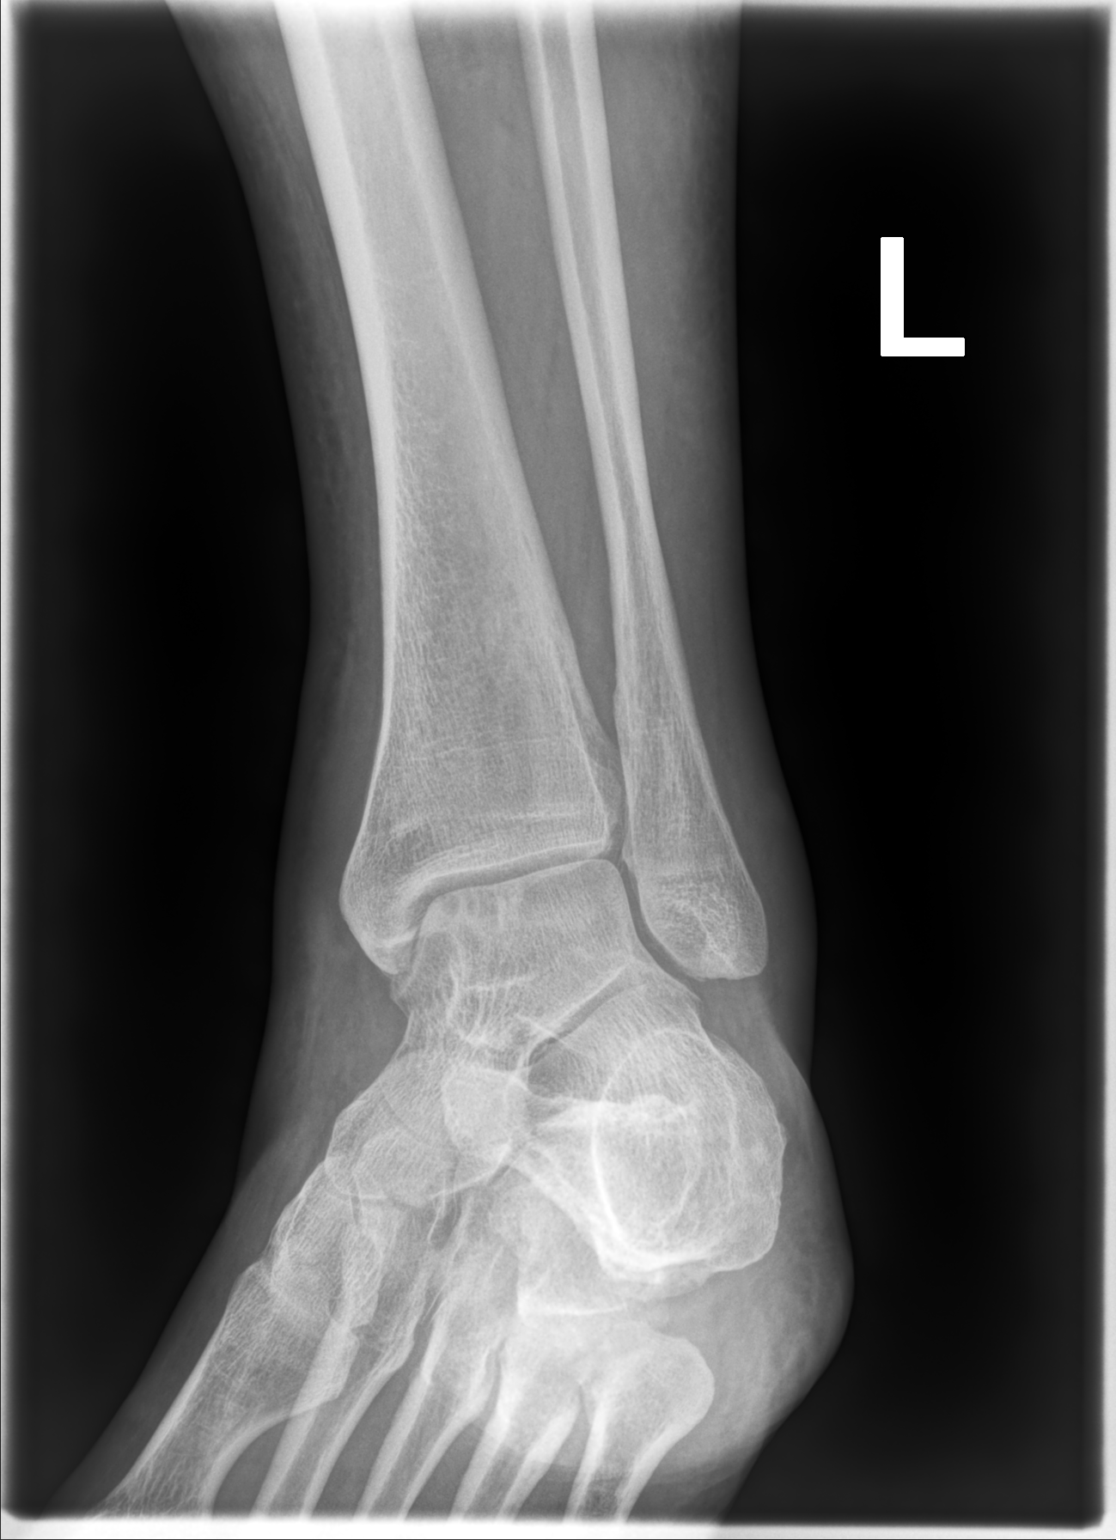

[ankle lat]
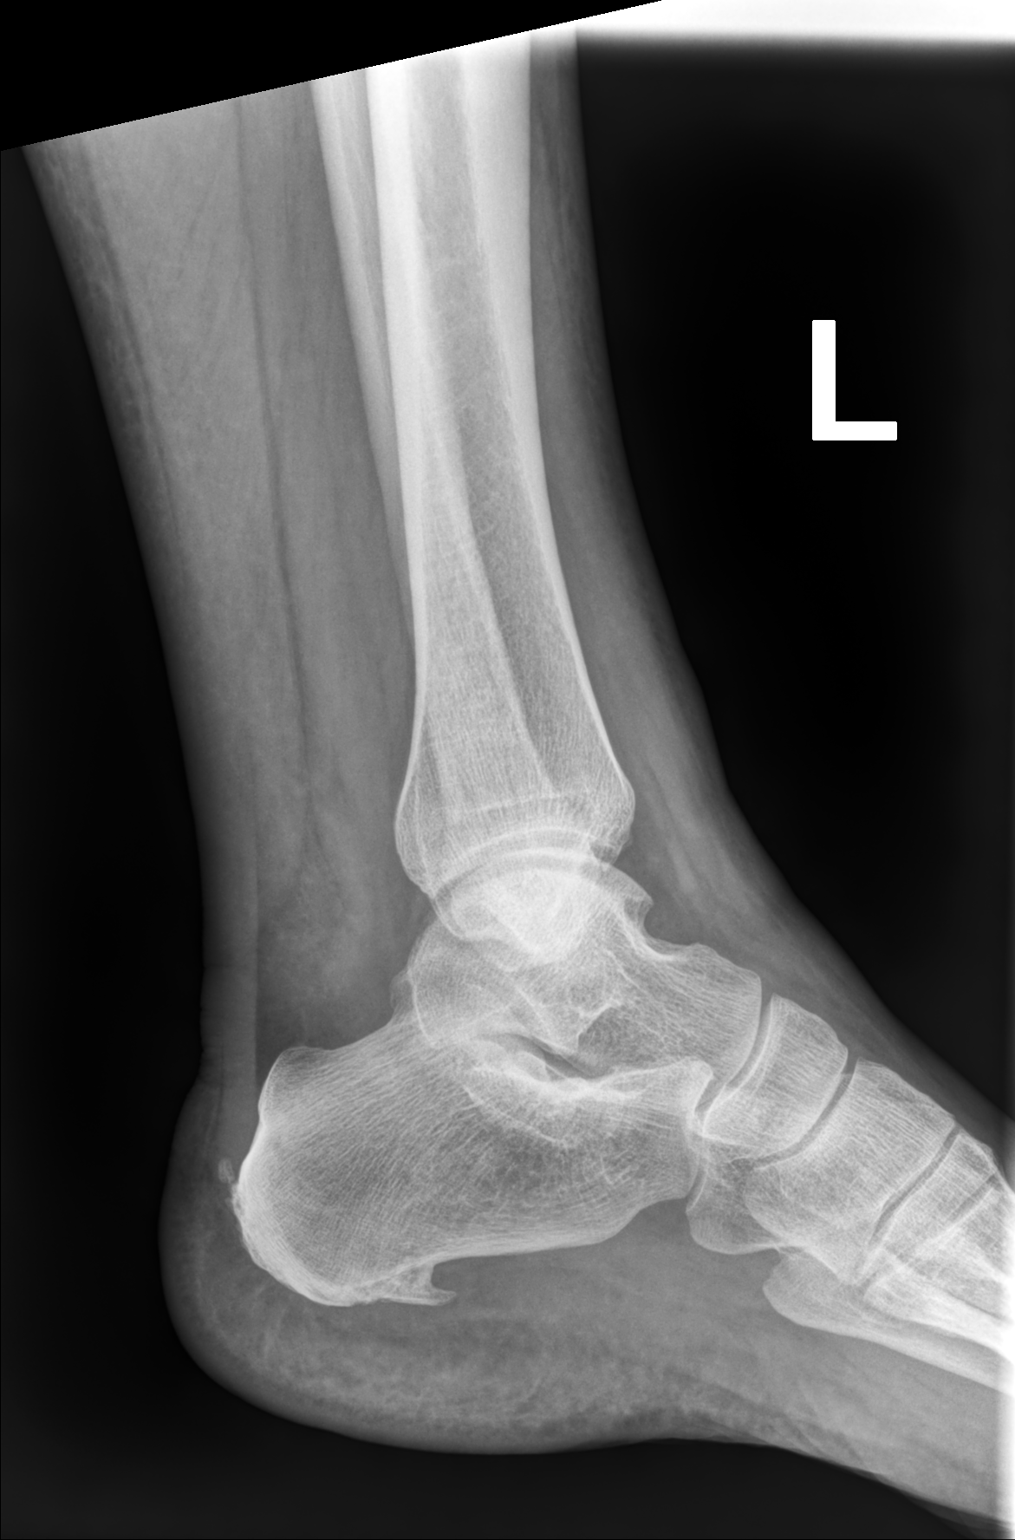

[3 of 3 positions shown; findings below may reference images not displayed]

FINDINGS: Diffuse soft tissue swelling.

Bones appear slightly demineralized.

Minimal degenerative changes at ankle joint.

No acute fracture, dislocation, or bone destruction.

Plantar calcaneal spur present.
IMPRESSION: Calcaneal spurring.

Minimal degenerative changes at the ankle joint.

No acute abnormalities.

## 2018-11-18 IMAGING — DX DG CHEST 2V
2 series · 2 of 2 positions shown · non-contrast
Comparison: CT chest 03/05/2012. PA and lateral chest 02/20/2012
and 03/21/2015.

CLINICAL DATA: Left foot and ankle pain for 2 weeks. History of
COPD.

EXAM:
CHEST - 2 VIEW

[chest pa]
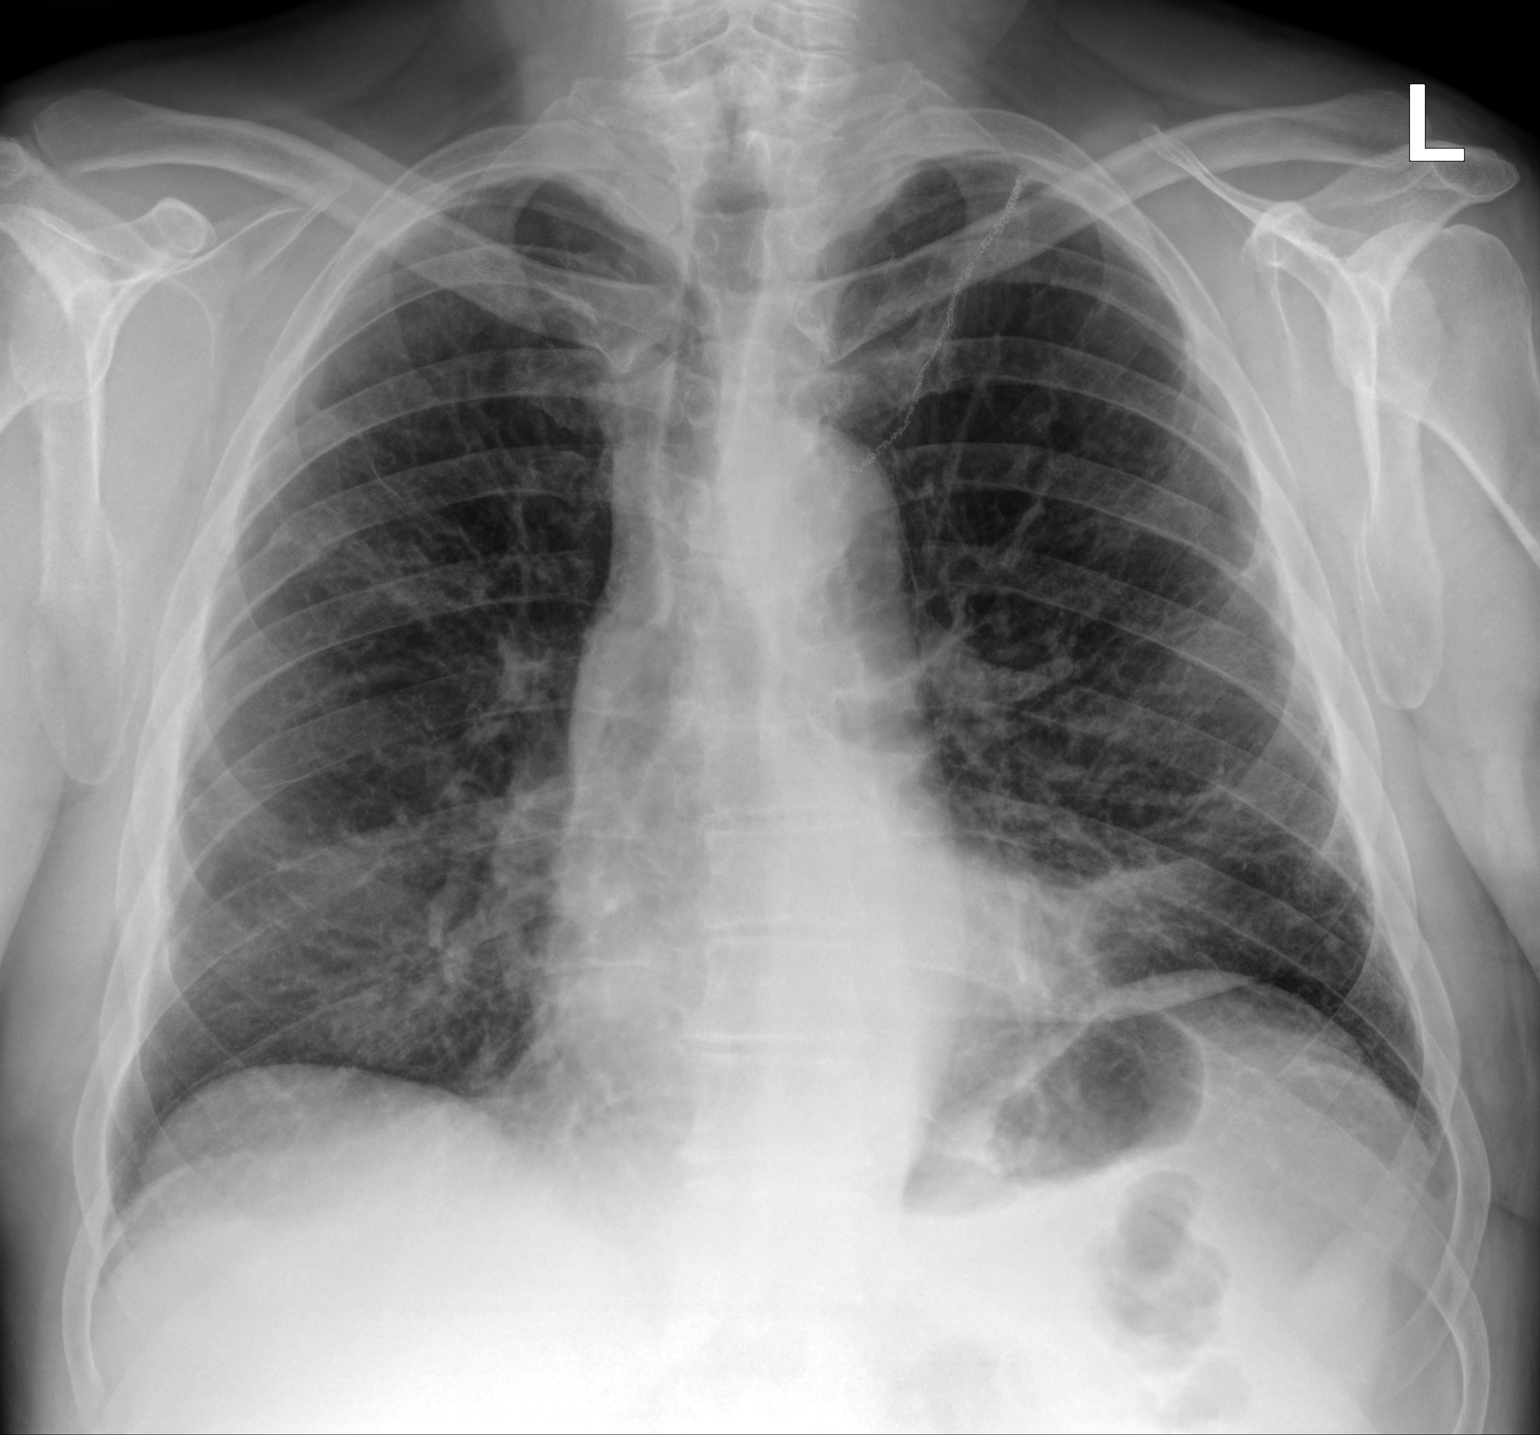

[chest lat]
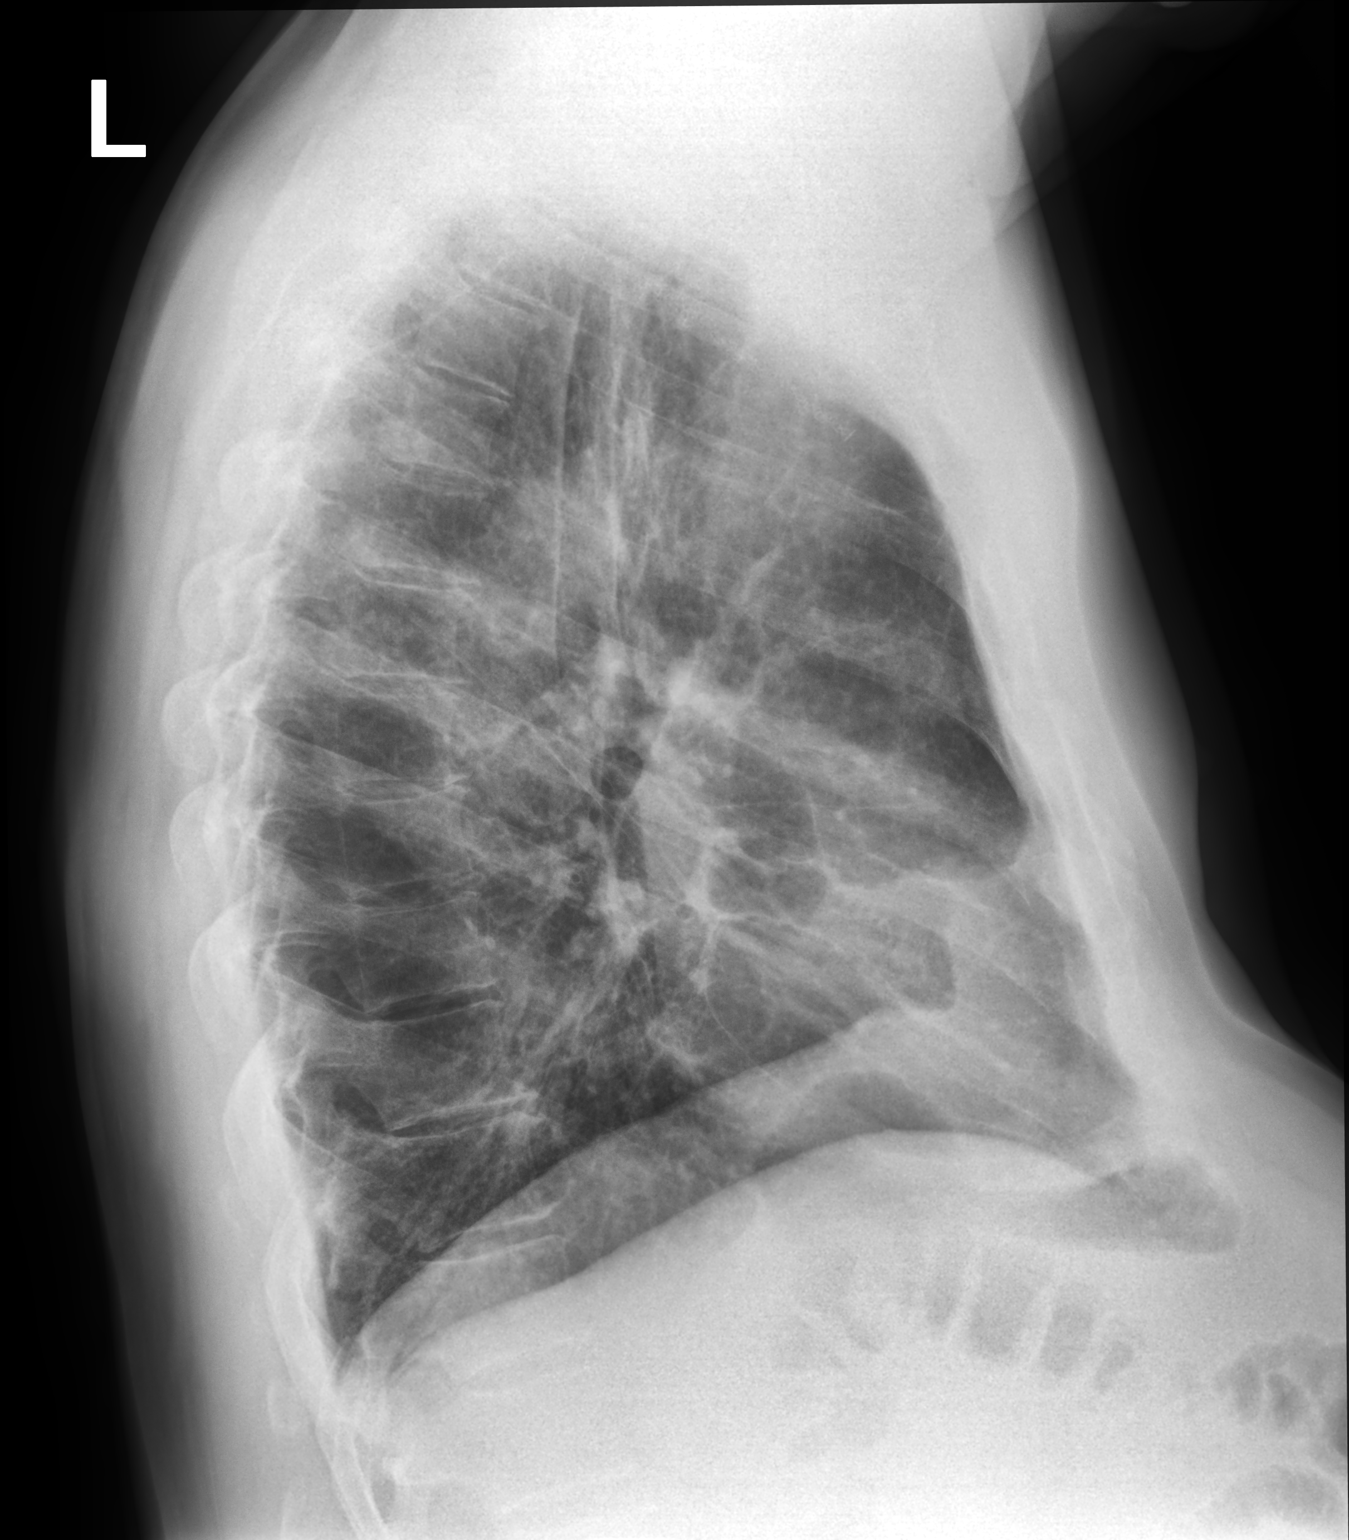

[2 of 2 positions shown; findings below may reference images not displayed]

FINDINGS: The lungs are emphysematous. Chronic opacity in the lingula is
unchanged. No consolidative process, pneumothorax or effusion.
Suture material in the left upper lobe is noted. Heart size is
normal. Aortic atherosclerosis is seen. Remote right sixth rib
fracture is noted.
IMPRESSION: Emphysema without acute disease.

Atherosclerosis.

## 2019-05-05 ENCOUNTER — Other Ambulatory Visit: Payer: Self-pay | Admitting: Adult Health

## 2019-05-06 ENCOUNTER — Encounter: Payer: Self-pay | Admitting: Family Medicine

## 2019-05-06 NOTE — Telephone Encounter (Signed)
Sent to the pharmacy by e-scribe.  Letter sent to the pharmacy.

## 2019-05-11 ENCOUNTER — Telehealth: Payer: Self-pay | Admitting: Family Medicine

## 2019-05-11 NOTE — Telephone Encounter (Signed)
Copied from [place] [id number]. Topic: General - Other >> May 11, 2019 10:05 AM Oneta Rack wrote: Patient dropped off [employer] physical / EKG results last Thursday and states DOT doctor advised PCP should review EKG, patient concerned he has not heard from PCP and would like a follow up call today, please advise

## 2019-05-11 NOTE — Telephone Encounter (Signed)
Attempted to call. No answer. Left VM to call back

## 2019-06-10 ENCOUNTER — Encounter: Payer: Self-pay | Admitting: Adult Health

## 2019-08-22 ENCOUNTER — Other Ambulatory Visit: Payer: Self-pay

## 2019-08-22 ENCOUNTER — Telehealth: Payer: Self-pay | Admitting: *Deleted

## 2019-08-22 ENCOUNTER — Telehealth (INDEPENDENT_AMBULATORY_CARE_PROVIDER_SITE_OTHER): Payer: 59 | Admitting: Family Medicine

## 2019-08-22 DIAGNOSIS — Z20828 Contact with and (suspected) exposure to other viral communicable diseases: Secondary | ICD-10-CM

## 2019-08-22 DIAGNOSIS — Z20822 Contact with and (suspected) exposure to covid-19: Secondary | ICD-10-CM

## 2019-08-22 DIAGNOSIS — J069 Acute upper respiratory infection, unspecified: Secondary | ICD-10-CM

## 2019-08-22 NOTE — Progress Notes (Signed)
Virtual Visit via Video Note  I connected with Randall Davis on 08/22/19 at 11:00 AM EST by a video enabled telemedicine application 2/2 [id number] pandemic and verified that I am speaking with the correct person using two identifiers.  Location patient: home Location provider:work or home office Persons participating in the virtual visit: patient, provider  I discussed the limitations of evaluation and management by telemedicine and the availability of in person appointments. The patient expressed understanding and agreed to proceed.   HPI: Pt is a 68 yo male with pmh sig for COPD, allergies.  Pt has been sick x 1 wk with temp 101-21F, fatigue, productive cough, diarrhea when takes OTC generic nyquil, dizziness, HA, and rhinorrhea.  Denies sore throat, SOB, ear pain or pressure, loss taste or smell.  Tried generic nyquil, excedrin, ASA.  Has an inhaler and nasal spray.  States symptoms do not feel like a COPD exacerbation.  Pt's wife is sick with temp 63 F.     ROS: See pertinent positives and negatives per HPI.  Past Medical History:  Diagnosis Date  . Allergy   . Anxiety    with doctors only  . Anxiety disorder   . Blood transfusion without reported diagnosis    at birth  . COPD (chronic obstructive pulmonary disease) ([place])   . Diverticulitis   . Dry eyes   . ED (erectile dysfunction)   . Emphysema of lung ([place])   . Former smoker   . Lactose intolerance   . Pulmonary nodule    benign  . Small bowel obstruction [facility])     Past Surgical History:  Procedure Laterality Date  . COLONOSCOPY    . HEMORRHOID SURGERY    . LUNG SURGERY Left    due to pneumothorax in his 77s (with postoperative changes on L lung noted prev)  . POLYPECTOMY    . VIDEO BRONCHOSCOPY  03/19/2012   Procedure: VIDEO BRONCHOSCOPY WITH FLUORO;  Surgeon: Elsie Stain, MD;  [facility];  Service: Cardiopulmonary;  Laterality: N/A;    Family History  Problem Relation Age of Onset  . Asthma  Mother   . Allergies Mother   . Breast cancer Mother   . Prostate cancer Father   . Colon cancer Neg Hx   . Colon polyps Neg Hx   . Rectal cancer Neg Hx   . Stomach cancer Neg Hx   . Esophageal cancer Neg Hx     SOCIAL HX: truck driver   Current Outpatient Medications:  .  albuterol (VENTOLIN HFA) 108 (90 Base) MCG/ACT inhaler, INHALE 2 PUFFS INTO THE LUNGS EVERY 6 HOURS AS NEEDED, Disp: 8.5 g, Rfl: 2 .  aspirin-acetaminophen-caffeine (EXCEDRIN MIGRAINE) 250-250-65 MG tablet, Take 2 tablets by mouth every 8 (eight) hours as needed for headache., Disp: , Rfl:  .  fluticasone (FLONASE) 50 MCG/ACT nasal spray, Place 2 sprays into both nostrils daily., Disp: 16 g, Rfl: 6 .  sildenafil (REVATIO) 20 MG tablet, TAKE 2-5 TABLETS BY MOUTH AS NEEDED FOR SEXUAL ACTIVITY, Disp: 100 tablet, Rfl: 1  EXAM:  VITALS per patient if applicable:  RR between 12-20 bpm  GENERAL: alert, oriented, appears well and in no acute distress  HEENT: atraumatic, conjunctiva clear, no obvious abnormalities on inspection of external nose and ears.  MM somewhat dry appearing.  NECK: normal movements of the head and neck  LUNGS: Occasional cough.  on inspection no signs of respiratory distress, breathing rate appears normal, no obvious gross SOB, gasping or wheezing  CV: no obvious cyanosis  MS: moves all visible extremities without noticeable abnormality  PSYCH/NEURO: pleasant and cooperative, no obvious depression or anxiety, speech and thought processing grossly intact  ASSESSMENT AND PLAN:  Discussed the following assessment and plan:  Viral URI with cough -symptoms c/w viral etiology, however must consider COPD exacerbation given hx and COVID-19. -supportive care -COVID testing advised. -given precautions  Suspected COVID-19 virus infection -given symptoms and pt's hx, COVID testing advised -given info on community testing sites. -reviewed symptoms  F/u prn   I discussed the assessment and  treatment plan with the patient. The patient was provided an opportunity to ask questions and all were answered. The patient agreed with the plan and demonstrated an understanding of the instructions.   The patient was advised to call back or seek an in-person evaluation if the symptoms worsen or if the condition fails to improve as anticipated.  Billie Ruddy, MD

## 2019-08-22 NOTE — Telephone Encounter (Signed)
Copied from [place] [id number]. Topic: General - Other >> Aug 22, 2019  1:23 PM Burchel, Abbi R wrote: Reason for CRM:  Pt requesting a note letting his employer know that he will be out of work while he awaits his COVID results. Please contact Rip Harbour with this information on pt's behalf.  Threasa Alpha Ph: [phone]

## 2019-08-22 NOTE — Telephone Encounter (Signed)
Randall Davis pt 

## 2019-08-23 ENCOUNTER — Encounter: Payer: Self-pay | Admitting: Adult Health

## 2019-08-23 ENCOUNTER — Telehealth: Payer: Self-pay | Admitting: Adult Health

## 2019-08-23 DIAGNOSIS — U071 COVID-19: Secondary | ICD-10-CM

## 2019-08-23 LAB — NOVEL CORONAVIRUS, NAA: SARS-CoV-2, NAA: DETECTED — AB

## 2019-08-23 MED ORDER — BENZONATATE 200 MG PO CAPS: 200.0000 mg | ORAL_CAPSULE | Freq: Two times a day (BID) | ORAL | 0 refills | Status: AC | PRN

## 2019-08-23 NOTE — Telephone Encounter (Signed)
Pt. Called to receive COVID test results.  Advised of positive results.  Was advised of CDC guidelines for self isolation/ ending isolation.  (see result note)  Pt. C/o low grade fever, productive cough, and weakness.  Onset of symptoms reported as of 10/23.  Pt. Requesting call from PCP office re: any recommendations for cough.  Reported the cough medication he was taking caused diarrhea.  Denied shortness of breath.  Denied chest tightness.  Requesting a work excuse, as he has been out of work since Monday.  Advised will send note to the office for Provider to review and advise.  Pt. Agreed with plan.

## 2019-08-23 NOTE — Telephone Encounter (Signed)
Spoke to patient about covid and covid symptoms.  Will send in Tessalon pearls to help with cough he is experiencing. He can take these to help him sleep.   Will also send in work note to his [employer]

## 2019-08-23 NOTE — Telephone Encounter (Signed)
Ok for letter

## 2019-08-23 NOTE — Telephone Encounter (Signed)
Letter done and sent to his myChart

## 2019-08-30 ENCOUNTER — Telehealth: Payer: Self-pay | Admitting: *Deleted

## 2019-08-30 NOTE — Telephone Encounter (Signed)
Copied from [place] [phone]. Topic: General - Other >> Aug 30, 2019 10:19 AM Rainey Pines A wrote: Patient would like to speak with nurse in regards to patient returning back to work and some of his symptoms still lingering such as not being able to eat his cough, and loss of energy. Please advise.

## 2019-08-30 NOTE — Telephone Encounter (Signed)
Spoke to the pt.  No fever since Thursday.  He states his cough has changed.  The inhaler seems to be the only thing that works.  Cough syrup makes him sleepy so prefers not to take it.   He was concerned that he may need an antibiotic.  Advised that he will need to be evaluated for an antibiotic.  Pt states he is getting better so will hold off.  Mentioned that his employer would like to bring him back in on Monday and work half days for awhile.  Pt continues to experience fatigue.  Would like know what [name] thinks.  Prefers a call back from [place] if possible.

## 2019-09-02 ENCOUNTER — Telehealth: Payer: Self-pay | Admitting: *Deleted

## 2019-09-02 NOTE — Telephone Encounter (Signed)
Copied from [place] [phone]. Topic: General - Call Back - No Documentation >> Sep 01, 2019  4:28 PM Randall Davis, [place] wrote: Reason for CRM: Pt called in stating he just missed a call from [facility]. Please advise as patient states they have things to discuss.

## 2020-02-02 ENCOUNTER — Encounter: Payer: Self-pay | Admitting: Adult Health

## 2020-02-02 ENCOUNTER — Telehealth: Payer: Self-pay | Admitting: Adult Health

## 2020-02-02 NOTE — Telephone Encounter (Signed)
This pt needs to get on the schedule for CPX before meds can be refills.  Not seen since 2019.  Please schedule.  Can then refill until seen

## 2020-02-02 NOTE — Telephone Encounter (Signed)
Pt is requesting a refill on Albuterol and Flonase. Pt uses [facility] representative in [place]. Thanks

## 2020-02-03 ENCOUNTER — Other Ambulatory Visit: Payer: Self-pay | Admitting: Adult Health

## 2020-02-03 MED ORDER — ALBUTEROL SULFATE HFA 108 (90 BASE) MCG/ACT IN AERS: 1.0000 | INHALATION_SPRAY | Freq: Four times a day (QID) | RESPIRATORY_TRACT | 0 refills | Status: AC | PRN

## 2020-02-03 MED ORDER — FLUTICASONE PROPIONATE 50 MCG/ACT NA SUSP: 2.0000 | Freq: Every day | NASAL | 0 refills | Status: AC

## 2020-02-03 NOTE — Telephone Encounter (Signed)
After Hour Nurse line transferred pt with no information other than the pt is upset with his doctor and wants to speak to the office manager. Pt came onto the line and stated that his PCP has not even attempted to reach out to the pt about being seen until he asked for his inhaler and Flonase to be refilled. He is upset that [name] will not fill his px until he is seen for a CPE but his job has taken over his CPE's and has them forward that info to his PCP. He said his work is going to be doing another one soon and feels that [name] should have told him that he will fill his px but asked him when he could come into the office to be seen. Pt states that he feels [name] does not care for his well being and is frustrated that he is unable to relieve his allergies just b/c his doctor wants to do a CPE that is not as extensive has the CPE he gets from his job. He feels this is unnecessary and if there is no resolution then he will find another PCP.    Informed Randall Davis of this information and she said she will speak to [facility] and get back with the pt.

## 2020-03-02 ENCOUNTER — Encounter: Payer: 59 | Admitting: Adult Health

## 2020-06-29 DIAGNOSIS — K6389 Other specified diseases of intestine: Secondary | ICD-10-CM | POA: Diagnosis not present

## 2020-06-29 DIAGNOSIS — Z86010 Personal history of colonic polyps: Secondary | ICD-10-CM | POA: Diagnosis not present

## 2020-11-29 ENCOUNTER — Encounter: Payer: Self-pay | Admitting: Gastroenterology

## 2021-01-10 DIAGNOSIS — M79671 Pain in right foot: Secondary | ICD-10-CM | POA: Diagnosis not present

## 2021-01-10 DIAGNOSIS — M7660 Achilles tendinitis, unspecified leg: Secondary | ICD-10-CM | POA: Diagnosis not present

## 2021-01-10 DIAGNOSIS — M19071 Primary osteoarthritis, right ankle and foot: Secondary | ICD-10-CM | POA: Diagnosis not present

## 2021-01-10 DIAGNOSIS — M7751 Other enthesopathy of right foot: Secondary | ICD-10-CM | POA: Diagnosis not present

## 2021-03-01 DIAGNOSIS — Z8042 Family history of malignant neoplasm of prostate: Secondary | ICD-10-CM | POA: Diagnosis not present

## 2021-03-01 DIAGNOSIS — R972 Elevated prostate specific antigen [PSA]: Secondary | ICD-10-CM | POA: Diagnosis not present

## 2021-03-01 DIAGNOSIS — R399 Unspecified symptoms and signs involving the genitourinary system: Secondary | ICD-10-CM | POA: Diagnosis not present

## 2021-04-05 DIAGNOSIS — N529 Male erectile dysfunction, unspecified: Secondary | ICD-10-CM | POA: Diagnosis not present

## 2021-07-04 DIAGNOSIS — D751 Secondary polycythemia: Secondary | ICD-10-CM | POA: Diagnosis not present

## 2021-07-04 DIAGNOSIS — J449 Chronic obstructive pulmonary disease, unspecified: Secondary | ICD-10-CM | POA: Diagnosis not present

## 2021-07-04 DIAGNOSIS — Z760 Encounter for issue of repeat prescription: Secondary | ICD-10-CM | POA: Diagnosis not present

## 2021-07-04 DIAGNOSIS — F418 Other specified anxiety disorders: Secondary | ICD-10-CM | POA: Diagnosis not present

## 2021-08-08 DIAGNOSIS — Z23 Encounter for immunization: Secondary | ICD-10-CM | POA: Diagnosis not present

## 2021-08-08 DIAGNOSIS — S91331A Puncture wound without foreign body, right foot, initial encounter: Secondary | ICD-10-CM | POA: Diagnosis not present

## 2021-08-08 DIAGNOSIS — Z760 Encounter for issue of repeat prescription: Secondary | ICD-10-CM | POA: Diagnosis not present

## 2021-08-16 DIAGNOSIS — R69 Illness, unspecified: Secondary | ICD-10-CM | POA: Diagnosis not present

## 2022-01-01 DIAGNOSIS — Z0000 Encounter for general adult medical examination without abnormal findings: Secondary | ICD-10-CM | POA: Diagnosis not present

## 2022-01-01 DIAGNOSIS — Z760 Encounter for issue of repeat prescription: Secondary | ICD-10-CM | POA: Diagnosis not present

## 2022-01-01 DIAGNOSIS — Z125 Encounter for screening for malignant neoplasm of prostate: Secondary | ICD-10-CM | POA: Diagnosis not present

## 2022-01-01 DIAGNOSIS — J449 Chronic obstructive pulmonary disease, unspecified: Secondary | ICD-10-CM | POA: Diagnosis not present

## 2022-01-01 DIAGNOSIS — F418 Other specified anxiety disorders: Secondary | ICD-10-CM | POA: Diagnosis not present

## 2023-01-26 NOTE — Therapy (Signed)
OUTPATIENT OCCUPATIONAL THERAPY ORTHO EVALUATION  Patient Name: Randall Davis MRN: [id number] DOB:May 26, 1951, 72 y.o., male Today's Date: 01/26/2023  PCP: Shirline FreesNafziger, Cory, NP REFERRING PROVIDER:  Filomena JunglingEdwards, Jerry, NP    END OF SESSION:   Past Medical History:  Diagnosis Date   Allergy    Anxiety    with doctors only   Anxiety disorder    Blood transfusion without reported diagnosis    at birth   COPD (chronic obstructive pulmonary disease) (HCC)    Diverticulitis    Dry eyes    ED (erectile dysfunction)    Emphysema of lung (HCC)    Former smoker    Lactose intolerance    Pulmonary nodule    benign   Small bowel obstruction (HCC)    Past Surgical History:  Procedure Laterality Date   COLONOSCOPY     HEMORRHOID SURGERY     LUNG SURGERY Left    due to pneumothorax in his 8620s (with postoperative changes on L lung noted prev)   POLYPECTOMY     VIDEO BRONCHOSCOPY  03/19/2012   Procedure: VIDEO BRONCHOSCOPY WITH FLUORO;  Surgeon: Storm FriskPatrick E Wright, MD;  Location: [facility];  Service: Cardiopulmonary;  Laterality: N/A;   Patient Active Problem List   Diagnosis Date Noted   Advance care planning 09/27/2015   Pain of occiput 09/27/2015   Allergic rhinitis, cause unspecified 07/10/2014   Dry eyes 09/25/2013   Nonspecific abnormal finding in stool contents 05/30/2013   Situational anxiety 05/29/2013   SBO (small bowel obstruction) 05/24/2013   Pulmonary nodule 11/23/2012   Routine general medical examination at a health care facility 11/23/2012   Abnormal CT of the chest 03/12/2012   COPD (chronic obstructive pulmonary disease) 02/02/2012   ED (erectile dysfunction) 02/02/2012   FH: prostate cancer 02/02/2012    ONSET DATE: ***  REFERRING DIAG:  M79.601 (ICD-10-CM) - Right arm pain  M79.642 (ICD-10-CM) - Left hand pain    THERAPY DIAG:  No diagnosis found.  Rationale for Evaluation and Treatment: Rehabilitation  SUBJECTIVE:   SUBJECTIVE STATEMENT: He  states ***.   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   PRECAUTIONS: {Therapy precautions:24002}  WEIGHT BEARING RESTRICTIONS: {Yes ***/No:24003}  PAIN:  Are you having pain? {OPRCPAIN:27236}  FALLS: Has patient fallen in last 6 months? {fallsyesno:27318}  LIVING ENVIRONMENT: Lives with: {OPRC lives with:25569::"lives with their family"} Lives in: {Lives in:25570} Stairs: {opstairs:27293} Has following equipment at home: {Assistive devices:23999}  PLOF: {PLOF:24004}  PATIENT GOALS: ***  NEXT MD VISIT: ***   OBJECTIVE: (All objective assessments below are from initial evaluation on: 01/28/23 unless otherwise specified.)   HAND DOMINANCE: Right ***  ADLs: Overall ADLs: States decreased ability to grab, hold household objects, pain and inability to open containers, perform FMS tasks (manipulate fasteners on clothing), mild to moderate bathing problems as well. ***   FUNCTIONAL OUTCOME MEASURES: Eval: Quck DASH ***% impairment today  (Higher % Score  =  More Impairment)    Patient Specific Functional Scale: *** (***, ***, ***)  (Higher Score  =  Better Ability for the Selected Tasks)     Patient Rated Wrist Evaluation (PRWE): Pain: ***/50; Function: ***/50; Total Score: ***/100 (Higher Score  =  More Pain and/or Debility)    UPPER EXTREMITY ROM     Shoulder to Wrist AROM Right eval Left eval  Shoulder flexion  Shoulder abduction    Shoulder extension    Shoulder internal rotation    Shoulder external rotation    Elbow flexion    Elbow extension    Forearm supination    Forearm pronation     Wrist flexion    Wrist extension    Wrist ulnar deviation    Wrist radial deviation    Functional dart thrower's motion (F-DTM) in ulnar flexion    F-DTM in radial extension     (Blank rows = not tested)   Hand AROM  Right eval Left eval  Full Fist Ability (or Gap to Distal Palmar Crease)    Thumb Opposition  (Kapandji Scale)     Thumb MCP (0-60)    Thumb IP (0-80)    Thumb Radial Abduction Span     Thumb Palmar Abduction Span     Index MCP (0-90)     Index PIP (0-100)     Index DIP (0-70)      Long MCP (0-90)      Long PIP (0-100)      Long DIP (0-70)      Ring MCP (0-90)      Ring PIP (0-100)      Ring DIP (0-70)      Little MCP (0-90)      Little PIP (0-100)      Little DIP (0-70)      (Blank rows = not tested)   UPPER EXTREMITY MMT:    Eval: *** NT at eval due to recent and still healing injuries. Will be tested when appropriate.   MMT Right TBD Left TBD  Shoulder flexion    Shoulder abduction    Shoulder adduction    Shoulder extension    Shoulder internal rotation    Shoulder external rotation    Middle trapezius    Lower trapezius    Elbow flexion    Elbow extension    Forearm supination    Forearm pronation    Wrist flexion    Wrist extension    Wrist ulnar deviation    Wrist radial deviation    (Blank rows = not tested)  HAND FUNCTION: Eval: Observed weakness in affected hand.  Grip strength Right: *** lbs, Left: *** lbs   COORDINATION: Eval: Observed coordination impairments with affected hand. Box and Blocks Test: *** Blocks today (*** is [facility]); 9 Hole Peg Test Right: ***sec, Left: *** sec (*** sec is WFL)   SENSATION: Eval: *** Light touch intact today, though diminished around sx area    EDEMA:   Eval: *** Mildly swollen in hand and wrist today, ***cm circumferentially around ***  COGNITION: Eval: Overall cognitive status: WFL for evaluation today ***  OBSERVATIONS:   Eval: ***   TODAY'S TREATMENT:  Post-evaluation treatment: ***    PATIENT EDUCATION: Education details: See tx section above for details  Person educated: Patient Education method: Engineer, structural, Teach back, Handouts  Education comprehension: States and demonstrates  understanding, Additional Education required    HOME EXERCISE PROGRAM: See tx section above for details    GOALS: Goals reviewed with patient? Yes   SHORT TERM GOALS: (STG required if POC>30 days) Target Date: 02/13/23  Pt will obtain protective, custom orthotic. Goal status: TBD/PRN,  MET ***  2.  Pt will demo/state understanding of initial HEP to improve pain levels and prerequisite motion. Goal status: INITIAL   LONG TERM GOALS: Target Date: 03/13/23  Pt will improve functional ability by decreased impairment per Quick DASH /  PSFS / PRWE assessment from *** to *** or better, for better quality of life. Goal status: INITIAL  2.  Pt will improve grip strength in *** hand from ***lbs to at least ***lbs for functional use at home and in IADLs. Goal status: INITIAL  3.  Pt will improve A/ROM in *** from *** to at least ***, to have functional motion for tasks like reach and grasp.  Goal status: INITIAL  4.  Pt will improve strength in *** from *** MMT to at least *** MMT to have increased functional ability to carry out selfcare and higher-level homecare tasks with no difficulty. Goal status: INITIAL  5.  Pt will improve coordination skills in ***, as seen by better score on *** testing to have increased functional ability to carry out fine motor tasks (fasteners, etc.) and more complex, coordinated IADLs (meal prep, sports, etc.).  Goal status: INITIAL  6.  Pt will decrease pain at worst from ***/10 to ***/10 or better to have better sleep and occupational participation in daily roles. Goal status: INITIAL  ASSESSMENT:  CLINICAL IMPRESSION: Patient is a *** y.o. *** who was seen today for occupational therapy evaluation for ***.   PERFORMANCE DEFICITS: in functional skills including {OT physical skills:25468}, cognitive skills including {OT cognitive skills:25469}, and psychosocial skills including {OT psychosocial skills:25470}.   IMPAIRMENTS: are limiting patient from  {OT performance deficits:25471}.   COMORBIDITIES: {Comorbidities:25485} that affects occupational performance. Patient will benefit from skilled OT to address above impairments and improve overall function.  MODIFICATION OR ASSISTANCE TO COMPLETE EVALUATION: {OT modification:25474}  OT OCCUPATIONAL PROFILE AND HISTORY: {OT PROFILE AND HISTORY:25484}  CLINICAL DECISION MAKING: {OT CDM:25475}  REHAB POTENTIAL: {rehabpotential:25112}  EVALUATION COMPLEXITY: {Evaluation complexity:25115}      PLAN:  OT FREQUENCY: {rehab frequency:25116}  OT DURATION: 6 weeks (through 03/13/23 as needed)   PLANNED INTERVENTIONS: self care/ADL training, therapeutic exercise, therapeutic activity, neuromuscular re-education, manual therapy, scar mobilization, passive range of motion, splinting, electrical stimulation, ultrasound, fluidotherapy, compression bandaging, moist heat, cryotherapy, contrast bath, patient/family education, energy conservation, coping strategies training, DME and/or AE instructions, and Dry needling  RECOMMENDED OTHER SERVICES: none now   CONSULTED AND AGREED WITH PLAN OF CARE: Patient  PLAN FOR NEXT SESSION: ***   Fannie Knee, OT 01/26/2023, 10:26 AM

## 2023-01-28 ENCOUNTER — Ambulatory Visit: Payer: Medicare HMO | Attending: Nurse Practitioner | Admitting: Rehabilitative and Restorative Service Providers"

## 2023-01-28 ENCOUNTER — Encounter: Payer: Self-pay | Admitting: Rehabilitative and Restorative Service Providers"

## 2023-01-28 ENCOUNTER — Other Ambulatory Visit: Payer: Self-pay

## 2023-01-28 DIAGNOSIS — R600 Localized edema: Secondary | ICD-10-CM

## 2023-01-28 DIAGNOSIS — M79641 Pain in right hand: Secondary | ICD-10-CM

## 2023-01-28 DIAGNOSIS — M25641 Stiffness of right hand, not elsewhere classified: Secondary | ICD-10-CM

## 2023-01-28 DIAGNOSIS — M25511 Pain in right shoulder: Secondary | ICD-10-CM

## 2023-01-28 DIAGNOSIS — R278 Other lack of coordination: Secondary | ICD-10-CM | POA: Diagnosis present

## 2023-01-28 DIAGNOSIS — M6281 Muscle weakness (generalized): Secondary | ICD-10-CM | POA: Diagnosis present

## 2023-02-09 ENCOUNTER — Ambulatory Visit: Payer: Medicare HMO | Admitting: Rehabilitative and Restorative Service Providers"

## 2023-02-09 ENCOUNTER — Encounter: Payer: Self-pay | Admitting: Rehabilitative and Restorative Service Providers"

## 2023-02-09 DIAGNOSIS — R278 Other lack of coordination: Secondary | ICD-10-CM

## 2023-02-09 DIAGNOSIS — M25641 Stiffness of right hand, not elsewhere classified: Secondary | ICD-10-CM

## 2023-02-09 DIAGNOSIS — M25511 Pain in right shoulder: Secondary | ICD-10-CM

## 2023-02-09 DIAGNOSIS — M79641 Pain in right hand: Secondary | ICD-10-CM

## 2023-02-09 DIAGNOSIS — R600 Localized edema: Secondary | ICD-10-CM

## 2023-02-09 DIAGNOSIS — M6281 Muscle weakness (generalized): Secondary | ICD-10-CM
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
## 2023-02-23 ENCOUNTER — Ambulatory Visit: Payer: Medicare HMO | Admitting: Occupational Therapy

## 2023-03-04 ENCOUNTER — Ambulatory Visit: Payer: Medicare HMO | Admitting: Occupational Therapy

## 2023-03-11 ENCOUNTER — Encounter: Payer: Medicare HMO | Admitting: Occupational Therapy
